# Patient Record
Sex: Male | Born: 1970 | Race: White | Hispanic: No | Smoking: Former smoker
Health system: Southern US, Community
[De-identification: ages and names within clinical notes are randomized; demographics above are authoritative.]

## PROBLEM LIST (undated history)

## (undated) DIAGNOSIS — R569 Unspecified convulsions: Secondary | ICD-10-CM

---

## 1998-05-03 ENCOUNTER — Emergency Department (HOSPITAL_COMMUNITY): Admission: EM | Admit: 1998-05-03 | Discharge: 1998-05-03 | Payer: Self-pay | Admitting: Emergency Medicine

## 2016-01-20 ENCOUNTER — Ambulatory Visit (INDEPENDENT_AMBULATORY_CARE_PROVIDER_SITE_OTHER): Payer: BLUE CROSS/BLUE SHIELD | Admitting: Family Medicine

## 2016-01-20 VITALS — BP 132/84 | HR 90 | Temp 97.9°F | Resp 16 | Ht 72.0 in | Wt 186.0 lb

## 2016-01-20 DIAGNOSIS — K648 Other hemorrhoids: Secondary | ICD-10-CM | POA: Diagnosis not present

## 2016-01-20 DIAGNOSIS — K602 Anal fissure, unspecified: Secondary | ICD-10-CM

## 2016-01-20 MED ORDER — DILTIAZEM GEL 2 %: 1.0000 "application " | Freq: Three times a day (TID) | CUTANEOUS | 1 refills | Status: DC

## 2016-01-20 NOTE — Progress Notes (Signed)
   Harold Ellis is a 45 y.o. male who presents to [facility] today for blood when wipting:  1.  Blood when wiping:  Patient is been having blood when he wipes from the past several weeks. He denies any constipation. States that he usually has a bowel movement once a day this sometimes it's once every other day. No abdominal pain. No diarrhea. He has been seeing light streaks of blood only when he wipes but not every time. No blood in the toilet bowl. He does have some pain when he wraps describes it as feeling "raw". He has not tried anything for relief other than wetting a toilet paper and wiping with this.  Never had something like this before.   No weight loss. No melena.  ROS as above.    PMH reviewed. Patient is a nonsmoker.   No past medical history on file. No past surgical history on file.  Medications reviewed. Current Outpatient Prescriptions  Medication Sig Dispense Refill  . Lacosamide (VIMPAT PO) Take by mouth.    Marland Kitchen. ZONISAMIDE PO Take by mouth.     No current facility-administered medications for this visit.      Physical Exam:  BP 132/84 (BP Location: Right Arm, Patient Position: Sitting, Cuff Size: Normal)   Pulse 90   Temp 97.9 F (36.6 C)   Resp 16   Ht 6' (1.829 m)   Wt 186 lb (84.4 kg)   SpO2 99%   BMI 25.23 kg/m  Gen:  Alert, cooperative patient who appears stated age in no acute distress.  Vital signs reviewed. Rectal:  Anal fissure noted around 1:00 lesion. Also has small internal hemorrhoid. Otherwise negative exam. Good rectal tone.  Assessment and Plan:  1.  Anal fissure:  - With hemorrhoid. -Treat with topical diltiazem. -Also can try Tucks pads or other over-the-counter creams/wipes. -For the next week or so he should use moist baby while present in the toilet paper to help it heal. -Evidently he was using hydrogen peroxide on the area. I did discuss he should stop using this or anything else potentially  abrasive. -Follow-up of this in 3-4 weeks if no improvement. Sooner if worsening.

## 2016-01-20 NOTE — Patient Instructions (Addendum)
Use the diltiazem cream on your buttocks 2-3 times a day for the next 8 weeks. This will help things clear up.  For the next week or so you should use moist baby wipes when wiping.  If this flares up in the future you can try over-the-counter Anusol cream or wipes to help.  It was good to meet you today  Anal Fissure, Adult Introduction An anal fissure is a small tear or crack in the skin around the opening of the butt (anus).Bleeding from the tear or crack usually stops on its own within a few minutes. The bleeding may happen every time you poop (have a bowel movement) until the tear or crack heals. Follow these instructions at home: Eating and drinking  Avoid bananas and dairy products. These foods can make it hard to poop.  Drink enough fluid to keep your pee (urine) clear or pale yellow.  Eat a lot of fruit, whole grains, and vegetables. General instructions  Keep the butt area as clean and dry as you can.  Take a warm water bath (sitz bath) as told by your doctor. Do not use soap.  Take over-the-counter and prescription medicines only as told by your doctor.  Use creams or ointments only as told by your doctor.  Keep all follow-up visits as told by your doctor. This is important. Contact a doctor if:  You have more bleeding.  You have a fever.  You have watery poop (diarrhea) that is mixed with blood.  You have pain.  You problem gets worse, not better. This information is not intended to replace advice given to you by your health care provider. Make sure you discuss any questions you have with your health care provider. Document Released: 09/19/2010 Document Revised: 06/29/2015 Document Reviewed: 04/18/2014  2017 Elsevier     IF you received an x-ray today, you will receive an invoice from [facility]. Please contact [facility] at [phone] with questions or concerns regarding your invoice.   IF you received labwork today, you will  receive an invoice from [facility]. Please contact [facility] at [phone] with questions or concerns regarding your invoice.   Our billing staff will not be able to assist you with questions regarding bills from these companies.  You will be contacted with the lab results as soon as they are available. The fastest way to get your results is to activate your My Chart account. Instructions are located on the last page of this paperwork. If you have not heard from us regarding the results in 2 weeks, please contact this office.

## 2016-01-22 ENCOUNTER — Telehealth: Payer: Self-pay

## 2016-01-22 NOTE — Telephone Encounter (Signed)
Pt states that he is having a hard time getting his meds filled that was given to him at last visit, he has checked with several pharmacies and they can not make the compound-would like to talk with someone about possibly changing the medication  Best number [phone]

## 2016-01-23 ENCOUNTER — Telehealth: Payer: Self-pay | Admitting: Emergency Medicine

## 2016-01-23 MED ORDER — NITROGLYCERIN 0.4 % RE OINT: TOPICAL_OINTMENT | RECTAL | 0 refills | Status: DC

## 2016-01-23 NOTE — Telephone Encounter (Signed)
Dr. Gwendolyn GrantWalden addressed and handled.

## 2016-01-23 NOTE — Telephone Encounter (Signed)
Dr. Gwendolyn GrantWalden, [facility] pharmacy called regarding prescription Nitroglycerin 0.4% oint. The cost is very expensive and the pharmacist does not know of any alternative med.  Please advise

## 2016-01-23 NOTE — Telephone Encounter (Signed)
Pt still has not heard anything about this medication.  Gwendolyn GrantWalden is out for a while.  Can someone please check into this for him?

## 2016-01-23 NOTE — Telephone Encounter (Signed)
Can we sub diltiazem cream? Not available

## 2016-01-23 NOTE — Telephone Encounter (Signed)
LMOM (OK per HIPPA) w/details of new Rx and message from Dr Gwendolyn GrantWalden. Asked for CBs with any ?s or concerns, or if SE of HAs continues or is intolerable.

## 2016-01-23 NOTE — Telephone Encounter (Signed)
Please contact the patient and inform him I have sent in topical nitroglycerin to use instead of the diltiazem.  This will likely cause a 30 minute headache, but it resolves after 30 minutes and only lasts the first few days of application.  (This is why we try diltiazem first, it doesn't cause headache.)  Let me know if questions.  JW

## 2019-06-01 ENCOUNTER — Emergency Department (HOSPITAL_COMMUNITY): Payer: Self-pay

## 2019-06-01 ENCOUNTER — Other Ambulatory Visit: Payer: Self-pay

## 2019-06-01 ENCOUNTER — Emergency Department (HOSPITAL_COMMUNITY)
Admission: EM | Admit: 2019-06-01 | Discharge: 2019-06-01 | Disposition: A | Discharge status: Home / Self Care | Payer: Self-pay | Attending: Emergency Medicine | Admitting: Emergency Medicine

## 2019-06-01 ENCOUNTER — Encounter (HOSPITAL_COMMUNITY): Payer: Self-pay | Admitting: Emergency Medicine

## 2019-06-01 DIAGNOSIS — Z79891 Long term (current) use of opiate analgesic: Secondary | ICD-10-CM | POA: Insufficient documentation

## 2019-06-01 DIAGNOSIS — Z79899 Other long term (current) drug therapy: Secondary | ICD-10-CM | POA: Insufficient documentation

## 2019-06-01 DIAGNOSIS — R404 Transient alteration of awareness: Secondary | ICD-10-CM

## 2019-06-01 DIAGNOSIS — R4182 Altered mental status, unspecified: Secondary | ICD-10-CM | POA: Insufficient documentation

## 2019-06-01 DIAGNOSIS — R4189 Other symptoms and signs involving cognitive functions and awareness: Secondary | ICD-10-CM

## 2019-06-01 DIAGNOSIS — T1490XA Injury, unspecified, initial encounter: Secondary | ICD-10-CM

## 2019-06-01 DIAGNOSIS — Y990 Civilian activity done for income or pay: Secondary | ICD-10-CM | POA: Insufficient documentation

## 2019-06-01 DIAGNOSIS — Z8669 Personal history of other diseases of the nervous system and sense organs: Secondary | ICD-10-CM | POA: Insufficient documentation

## 2019-06-01 LAB — COMPREHENSIVE METABOLIC PANEL
ALT: 16 U/L (ref 0–44)
AST: 19 U/L (ref 15–41)
Albumin: 4 g/dL (ref 3.5–5.0)
Alkaline Phosphatase: 112 U/L (ref 38–126)
Anion gap: 8 (ref 5–15)
BUN: 12 mg/dL (ref 6–20)
CO2: 21 mmol/L — ABNORMAL LOW (ref 22–32)
Calcium: 8.7 mg/dL — ABNORMAL LOW (ref 8.9–10.3)
Chloride: 111 mmol/L (ref 98–111)
Creatinine, Ser: 1.13 mg/dL (ref 0.61–1.24)
GFR calc Af Amer: >60 mL/min (ref >60)
GFR calc non Af Amer: >60 mL/min (ref >60)
Glucose, Bld: 113 mg/dL — ABNORMAL HIGH (ref 70–99)
Potassium: 4.1 mmol/L (ref 3.5–5.1)
Sodium: 140 mmol/L (ref 135–145)
Total Bilirubin: 0.5 mg/dL (ref 0.3–1.2)
Total Protein: 6.3 g/dL — ABNORMAL LOW (ref 6.5–8.1)

## 2019-06-01 LAB — I-STAT CHEM 8, ED
BUN: 11 mg/dL (ref 6–20)
Calcium, Ion: 1.16 mmol/L (ref 1.15–1.40)
Chloride: 111 mmol/L (ref 98–111)
Creatinine, Ser: 1 mg/dL (ref 0.61–1.24)
Glucose, Bld: 111 mg/dL — ABNORMAL HIGH (ref 70–99)
HCT: 43 % (ref 39.0–52.0)
Hemoglobin: 14.6 g/dL (ref 13.0–17.0)
Potassium: 4 mmol/L (ref 3.5–5.1)
Sodium: 140 mmol/L (ref 135–145)
TCO2: 19 mmol/L — ABNORMAL LOW (ref 22–32)

## 2019-06-01 LAB — CBC
HCT: 43.3 % (ref 39.0–52.0)
Hemoglobin: 14.9 g/dL (ref 13.0–17.0)
MCH: 32.9 pg (ref 26.0–34.0)
MCHC: 34.4 g/dL (ref 30.0–36.0)
MCV: 95.6 fL (ref 80.0–100.0)
Platelets: 248 10*3/uL (ref 150–400)
RBC: 4.53 MIL/uL (ref 4.22–5.81)
RDW: 12.3 % (ref 11.5–15.5)
WBC: 4.7 10*3/uL (ref 4.0–10.5)
nRBC: 0 % (ref 0.0–0.2)

## 2019-06-01 LAB — SALICYLATE LEVEL: Salicylate Lvl: <7 mg/dL — ABNORMAL LOW (ref 7.0–30.0)

## 2019-06-01 LAB — PROTIME-INR
INR: 1 (ref 0.8–1.2)
Prothrombin Time: 13.1 seconds (ref 11.4–15.2)

## 2019-06-01 LAB — ETHANOL: Alcohol, Ethyl (B): <10 mg/dL (ref <10)

## 2019-06-01 LAB — ACETAMINOPHEN LEVEL: Acetaminophen (Tylenol), Serum: <10 ug/mL — ABNORMAL LOW (ref 10–30)

## 2019-06-01 LAB — SAMPLE TO BLOOD BANK

## 2019-06-01 LAB — LACTIC ACID, PLASMA: Lactic Acid, Venous: 1.2 mmol/L (ref 0.5–1.9)
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
## 2019-06-01 NOTE — ED Notes (Signed)
Patient verbalizes understanding of discharge instructions. Opportunity for questioning and answers were provided. Armband removed by staff, pt discharged from ED.  
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
## 2022-05-03 ENCOUNTER — Emergency Department (HOSPITAL_COMMUNITY): Payer: Managed Care, Other (non HMO)

## 2022-05-03 ENCOUNTER — Encounter (HOSPITAL_COMMUNITY): Payer: Self-pay

## 2022-05-03 ENCOUNTER — Inpatient Hospital Stay (HOSPITAL_COMMUNITY)
Admission: EM | Admit: 2022-05-03 | Discharge: 2022-05-06 | DRG: 982 | Disposition: A | Discharge status: Home / Self Care | Payer: Managed Care, Other (non HMO) | Attending: General Surgery | Admitting: General Surgery

## 2022-05-03 ENCOUNTER — Other Ambulatory Visit: Payer: Self-pay

## 2022-05-03 DIAGNOSIS — Y92410 Unspecified street and highway as the place of occurrence of the external cause: Secondary | ICD-10-CM

## 2022-05-03 DIAGNOSIS — S2232XA Fracture of one rib, left side, initial encounter for closed fracture: Secondary | ICD-10-CM | POA: Diagnosis present

## 2022-05-03 DIAGNOSIS — S0240DA Maxillary fracture, left side, initial encounter for closed fracture: Secondary | ICD-10-CM | POA: Diagnosis present

## 2022-05-03 DIAGNOSIS — S32049A Unspecified fracture of fourth lumbar vertebra, initial encounter for closed fracture: Secondary | ICD-10-CM | POA: Diagnosis present

## 2022-05-03 DIAGNOSIS — S32009A Unspecified fracture of unspecified lumbar vertebra, initial encounter for closed fracture: Secondary | ICD-10-CM | POA: Diagnosis present

## 2022-05-03 DIAGNOSIS — S32040A Wedge compression fracture of fourth lumbar vertebra, initial encounter for closed fracture: Secondary | ICD-10-CM

## 2022-05-03 DIAGNOSIS — G40909 Epilepsy, unspecified, not intractable, without status epilepticus: Secondary | ICD-10-CM | POA: Diagnosis present

## 2022-05-03 DIAGNOSIS — S066XAA Traumatic subarachnoid hemorrhage with loss of consciousness status unknown, initial encounter: Secondary | ICD-10-CM | POA: Diagnosis not present

## 2022-05-03 DIAGNOSIS — F17290 Nicotine dependence, other tobacco product, uncomplicated: Secondary | ICD-10-CM | POA: Diagnosis present

## 2022-05-03 DIAGNOSIS — I609 Nontraumatic subarachnoid hemorrhage, unspecified: Secondary | ICD-10-CM

## 2022-05-03 DIAGNOSIS — S02602A Fracture of unspecified part of body of left mandible, initial encounter for closed fracture: Secondary | ICD-10-CM | POA: Diagnosis present

## 2022-05-03 DIAGNOSIS — H1132 Conjunctival hemorrhage, left eye: Secondary | ICD-10-CM | POA: Diagnosis present

## 2022-05-03 DIAGNOSIS — S0240FA Zygomatic fracture, left side, initial encounter for closed fracture: Secondary | ICD-10-CM | POA: Diagnosis present

## 2022-05-03 DIAGNOSIS — S51012A Laceration without foreign body of left elbow, initial encounter: Secondary | ICD-10-CM | POA: Diagnosis present

## 2022-05-03 DIAGNOSIS — R413 Other amnesia: Secondary | ICD-10-CM | POA: Diagnosis present

## 2022-05-03 DIAGNOSIS — S02411A LeFort I fracture, initial encounter for closed fracture: Secondary | ICD-10-CM | POA: Diagnosis present

## 2022-05-03 HISTORY — DX: Unspecified convulsions: R56.9

## 2022-05-03 LAB — COMPREHENSIVE METABOLIC PANEL
ALT: 32 U/L (ref 0–44)
AST: 42 U/L — ABNORMAL HIGH (ref 15–41)
Albumin: 4.1 g/dL (ref 3.5–5.0)
Alkaline Phosphatase: 85 U/L (ref 38–126)
Anion gap: 10 (ref 5–15)
BUN: 16 mg/dL (ref 6–20)
CO2: 20 mmol/L — ABNORMAL LOW (ref 22–32)
Calcium: 8.2 mg/dL — ABNORMAL LOW (ref 8.9–10.3)
Chloride: 102 mmol/L (ref 98–111)
Creatinine, Ser: 1.15 mg/dL (ref 0.61–1.24)
GFR, Estimated: >60 mL/min (ref >60)
Glucose, Bld: 126 mg/dL — ABNORMAL HIGH (ref 70–99)
Potassium: 4.5 mmol/L (ref 3.5–5.1)
Sodium: 132 mmol/L — ABNORMAL LOW (ref 135–145)
Total Bilirubin: 0.7 mg/dL (ref 0.3–1.2)
Total Protein: 6.7 g/dL (ref 6.5–8.1)

## 2022-05-03 LAB — CBC WITH DIFFERENTIAL/PLATELET
Abs Immature Granulocytes: 0.11 10*3/uL — ABNORMAL HIGH (ref 0.00–0.07)
Basophils Absolute: 0.1 10*3/uL (ref 0.0–0.1)
Basophils Relative: 1 %
Eosinophils Absolute: 0 10*3/uL (ref 0.0–0.5)
Eosinophils Relative: 0 %
HCT: 43 % (ref 39.0–52.0)
Hemoglobin: 14.4 g/dL (ref 13.0–17.0)
Immature Granulocytes: 1 %
Lymphocytes Relative: 9 %
Lymphs Abs: 1 10*3/uL (ref 0.7–4.0)
MCH: 33 pg (ref 26.0–34.0)
MCHC: 33.5 g/dL (ref 30.0–36.0)
MCV: 98.6 fL (ref 80.0–100.0)
Monocytes Absolute: 0.5 10*3/uL (ref 0.1–1.0)
Monocytes Relative: 4 %
Neutro Abs: 10.3 10*3/uL — ABNORMAL HIGH (ref 1.7–7.7)
Neutrophils Relative %: 85 %
Platelets: 187 10*3/uL (ref 150–400)
RBC: 4.36 MIL/uL (ref 4.22–5.81)
RDW: 12.3 % (ref 11.5–15.5)
WBC: 12 10*3/uL — ABNORMAL HIGH (ref 4.0–10.5)
nRBC: 0 % (ref 0.0–0.2)

## 2022-05-03 LAB — RAPID URINE DRUG SCREEN, HOSP PERFORMED
Amphetamines: NOT DETECTED
Barbiturates: NOT DETECTED
Benzodiazepines: POSITIVE — AB
Cocaine: NOT DETECTED
Opiates: NOT DETECTED
Tetrahydrocannabinol: NOT DETECTED

## 2022-05-03 LAB — TYPE AND SCREEN
ABO/RH(D): O NEG
Antibody Screen: NEGATIVE

## 2022-05-03 LAB — CBG MONITORING, ED: Glucose-Capillary: 130 mg/dL — ABNORMAL HIGH (ref 70–99)

## 2022-05-03 LAB — ETHANOL: Alcohol, Ethyl (B): <10 mg/dL (ref <10)

## 2022-05-03 MED ORDER — LEVETIRACETAM IN NACL 1500 MG/100ML IV SOLN
1500.0000 mg | Freq: Once | INTRAVENOUS | Status: AC
  Administered 2022-05-04: 1500 mg via INTRAVENOUS
  Filled 2022-05-03 (×2): qty 100

## 2022-05-03 MED ORDER — LACTATED RINGERS IV SOLN: INTRAVENOUS | Status: DC

## 2022-05-03 MED ORDER — IOHEXOL 350 MG/ML SOLN
75.0000 mL | Freq: Once | INTRAVENOUS | Status: AC | PRN
  Administered 2022-05-03: 75 mL via INTRAVENOUS

## 2022-05-03 MED ORDER — MIDAZOLAM HCL 2 MG/2ML IJ SOLN
INTRAMUSCULAR | Status: AC
  Filled 2022-05-03: qty 2

## 2022-05-03 MED ORDER — MIDAZOLAM HCL 2 MG/2ML IJ SOLN
2.0000 mg | Freq: Once | INTRAMUSCULAR | Status: AC
  Administered 2022-05-03: 2 mg via INTRAVENOUS

## 2022-05-04 ENCOUNTER — Inpatient Hospital Stay (HOSPITAL_COMMUNITY): Payer: Managed Care, Other (non HMO) | Admitting: Anesthesiology

## 2022-05-04 ENCOUNTER — Other Ambulatory Visit: Payer: Self-pay

## 2022-05-04 ENCOUNTER — Encounter (HOSPITAL_COMMUNITY): Admission: EM | Disposition: A | Discharge status: Home / Self Care | Payer: Self-pay | Source: Home / Self Care

## 2022-05-04 ENCOUNTER — Encounter (HOSPITAL_COMMUNITY): Payer: Self-pay

## 2022-05-04 DIAGNOSIS — S51012A Laceration without foreign body of left elbow, initial encounter: Secondary | ICD-10-CM | POA: Diagnosis present

## 2022-05-04 DIAGNOSIS — S32049A Unspecified fracture of fourth lumbar vertebra, initial encounter for closed fracture: Secondary | ICD-10-CM | POA: Diagnosis present

## 2022-05-04 DIAGNOSIS — S0240DA Maxillary fracture, left side, initial encounter for closed fracture: Secondary | ICD-10-CM | POA: Diagnosis present

## 2022-05-04 DIAGNOSIS — S02411A LeFort I fracture, initial encounter for closed fracture: Secondary | ICD-10-CM | POA: Diagnosis present

## 2022-05-04 DIAGNOSIS — S02602A Fracture of unspecified part of body of left mandible, initial encounter for closed fracture: Secondary | ICD-10-CM | POA: Diagnosis not present

## 2022-05-04 DIAGNOSIS — S0240FA Zygomatic fracture, left side, initial encounter for closed fracture: Secondary | ICD-10-CM

## 2022-05-04 DIAGNOSIS — G40909 Epilepsy, unspecified, not intractable, without status epilepticus: Secondary | ICD-10-CM | POA: Diagnosis present

## 2022-05-04 DIAGNOSIS — H1132 Conjunctival hemorrhage, left eye: Secondary | ICD-10-CM | POA: Diagnosis present

## 2022-05-04 DIAGNOSIS — R413 Other amnesia: Secondary | ICD-10-CM | POA: Diagnosis present

## 2022-05-04 DIAGNOSIS — S066XAA Traumatic subarachnoid hemorrhage with loss of consciousness status unknown, initial encounter: Secondary | ICD-10-CM | POA: Diagnosis present

## 2022-05-04 DIAGNOSIS — I609 Nontraumatic subarachnoid hemorrhage, unspecified: Secondary | ICD-10-CM

## 2022-05-04 DIAGNOSIS — Y92410 Unspecified street and highway as the place of occurrence of the external cause: Secondary | ICD-10-CM | POA: Diagnosis not present

## 2022-05-04 DIAGNOSIS — F17290 Nicotine dependence, other tobacco product, uncomplicated: Secondary | ICD-10-CM | POA: Diagnosis present

## 2022-05-04 DIAGNOSIS — S2232XA Fracture of one rib, left side, initial encounter for closed fracture: Secondary | ICD-10-CM | POA: Diagnosis present

## 2022-05-04 DIAGNOSIS — S32009A Unspecified fracture of unspecified lumbar vertebra, initial encounter for closed fracture: Secondary | ICD-10-CM | POA: Diagnosis present

## 2022-05-04 HISTORY — PX: ORIF MANDIBULAR FRACTURE: SHX2127

## 2022-05-04 LAB — HIV ANTIBODY (ROUTINE TESTING W REFLEX): HIV Screen 4th Generation wRfx: NONREACTIVE

## 2022-05-04 LAB — ABO/RH: ABO/RH(D): O NEG

## 2022-05-04 SURGERY — OPEN REDUCTION INTERNAL FIXATION (ORIF) MANDIBULAR FRACTURE
Anesthesia: General | Site: Mouth

## 2022-05-04 MED ORDER — CEFAZOLIN SODIUM-DEXTROSE 1-4 GM/50ML-% IV SOLN
1.0000 g | Freq: Three times a day (TID) | INTRAVENOUS | Status: DC
  Administered 2022-05-04 – 2022-05-06 (×6): 1 g via INTRAVENOUS
  Filled 2022-05-04 (×7): qty 50

## 2022-05-04 MED ORDER — LIDOCAINE-EPINEPHRINE 1 %-1:100000 IJ SOLN
INTRAMUSCULAR | Status: AC
  Filled 2022-05-04: qty 1

## 2022-05-04 MED ORDER — PHENYLEPHRINE 80 MCG/ML (10ML) SYRINGE FOR IV PUSH (FOR BLOOD PRESSURE SUPPORT)
PREFILLED_SYRINGE | INTRAVENOUS | Status: AC
  Filled 2022-05-04: qty 20

## 2022-05-04 MED ORDER — ONDANSETRON HCL 4 MG/2ML IJ SOLN
INTRAMUSCULAR | Status: AC
  Filled 2022-05-04: qty 2

## 2022-05-04 MED ORDER — ROCURONIUM BROMIDE 10 MG/ML (PF) SYRINGE
PREFILLED_SYRINGE | INTRAVENOUS | Status: DC | PRN
  Administered 2022-05-04: 10 mg via INTRAVENOUS
  Administered 2022-05-04: 60 mg via INTRAVENOUS
  Administered 2022-05-04: 10 mg via INTRAVENOUS

## 2022-05-04 MED ORDER — ONDANSETRON HCL 4 MG/2ML IJ SOLN
INTRAMUSCULAR | Status: DC | PRN
  Administered 2022-05-04: 4 mg via INTRAVENOUS

## 2022-05-04 MED ORDER — DEXAMETHASONE SODIUM PHOSPHATE 10 MG/ML IJ SOLN
INTRAMUSCULAR | Status: AC
  Filled 2022-05-04: qty 1

## 2022-05-04 MED ORDER — HYDROMORPHONE HCL 1 MG/ML IJ SOLN
INTRAMUSCULAR | Status: AC
  Filled 2022-05-04: qty 0.5

## 2022-05-04 MED ORDER — TOBRAMYCIN-DEXAMETHASONE 0.3-0.1 % OP OINT
TOPICAL_OINTMENT | Freq: Two times a day (BID) | OPHTHALMIC | Status: DC
  Administered 2022-05-04 – 2022-05-05 (×2): 1 via OPHTHALMIC
  Filled 2022-05-04: qty 3.5

## 2022-05-04 MED ORDER — ORAL CARE MOUTH RINSE: 15.0000 mL | Freq: Once | OROMUCOSAL | Status: AC

## 2022-05-04 MED ORDER — OXYMETAZOLINE HCL 0.05 % NA SOLN
NASAL | Status: DC | PRN
  Administered 2022-05-04: 1 via NASAL

## 2022-05-04 MED ORDER — LEVETIRACETAM IN NACL 500 MG/100ML IV SOLN
500.0000 mg | Freq: Two times a day (BID) | INTRAVENOUS | Status: DC
  Administered 2022-05-04 – 2022-05-06 (×5): 500 mg via INTRAVENOUS
  Filled 2022-05-04 (×5): qty 100

## 2022-05-04 MED ORDER — LIDOCAINE 2% (20 MG/ML) 5 ML SYRINGE
INTRAMUSCULAR | Status: AC
  Filled 2022-05-04: qty 5

## 2022-05-04 MED ORDER — CEFAZOLIN SODIUM 1 G IJ SOLR
INTRAMUSCULAR | Status: AC
  Filled 2022-05-04: qty 20

## 2022-05-04 MED ORDER — ONDANSETRON 4 MG PO TBDP: 4.0000 mg | ORAL_TABLET | Freq: Four times a day (QID) | ORAL | Status: DC | PRN

## 2022-05-04 MED ORDER — OXYMETAZOLINE HCL 0.05 % NA SOLN
NASAL | Status: DC | PRN
  Administered 2022-05-04: 1 via TOPICAL

## 2022-05-04 MED ORDER — OXYCODONE HCL 5 MG PO TABS: 5.0000 mg | ORAL_TABLET | ORAL | Status: DC | PRN

## 2022-05-04 MED ORDER — CHLORHEXIDINE GLUCONATE 0.12 % MT SOLN
OROMUCOSAL | Status: AC
  Administered 2022-05-04: 15 mL via OROMUCOSAL
  Filled 2022-05-04: qty 15

## 2022-05-04 MED ORDER — CEFAZOLIN SODIUM-DEXTROSE 2-3 GM-%(50ML) IV SOLR
INTRAVENOUS | Status: DC | PRN
  Administered 2022-05-04: 2 g via INTRAVENOUS

## 2022-05-04 MED ORDER — OXYMETAZOLINE HCL 0.05 % NA SOLN
NASAL | Status: AC
  Filled 2022-05-04: qty 30

## 2022-05-04 MED ORDER — LACTATED RINGERS IV SOLN: INTRAVENOUS | Status: DC

## 2022-05-04 MED ORDER — CHLORHEXIDINE GLUCONATE 0.12 % MT SOLN
15.0000 mL | Freq: Four times a day (QID) | OROMUCOSAL | Status: DC
  Administered 2022-05-04 – 2022-05-06 (×9): 15 mL via OROMUCOSAL
  Filled 2022-05-04 (×9): qty 15

## 2022-05-04 MED ORDER — PROPOFOL 10 MG/ML IV BOLUS
INTRAVENOUS | Status: AC
  Filled 2022-05-04: qty 20

## 2022-05-04 MED ORDER — LIDOCAINE-EPINEPHRINE 1 %-1:100000 IJ SOLN
INTRAMUSCULAR | Status: DC | PRN
  Administered 2022-05-04: 8 mL

## 2022-05-04 MED ORDER — PROMETHAZINE HCL 25 MG/ML IJ SOLN: 6.2500 mg | INTRAMUSCULAR | Status: DC | PRN

## 2022-05-04 MED ORDER — HYDROMORPHONE HCL 1 MG/ML IJ SOLN
INTRAMUSCULAR | Status: DC | PRN
  Administered 2022-05-04: .5 mg via INTRAVENOUS

## 2022-05-04 MED ORDER — OXYCODONE HCL 5 MG PO TABS: 5.0000 mg | ORAL_TABLET | Freq: Once | ORAL | Status: DC | PRN

## 2022-05-04 MED ORDER — FENTANYL CITRATE (PF) 250 MCG/5ML IJ SOLN
INTRAMUSCULAR | Status: AC
  Filled 2022-05-04: qty 5

## 2022-05-04 MED ORDER — HYDROMORPHONE HCL 1 MG/ML IJ SOLN: 0.2500 mg | INTRAMUSCULAR | Status: DC | PRN

## 2022-05-04 MED ORDER — ACETAMINOPHEN 500 MG PO TABS: 1000.0000 mg | ORAL_TABLET | Freq: Four times a day (QID) | ORAL | Status: DC

## 2022-05-04 MED ORDER — TOBRAMYCIN-DEXAMETHASONE 0.3-0.1 % OP OINT
TOPICAL_OINTMENT | OPHTHALMIC | Status: AC
  Filled 2022-05-04: qty 3.5

## 2022-05-04 MED ORDER — DEXAMETHASONE SODIUM PHOSPHATE 10 MG/ML IJ SOLN
INTRAMUSCULAR | Status: DC | PRN
  Administered 2022-05-04: 10 mg via INTRAVENOUS

## 2022-05-04 MED ORDER — METHOCARBAMOL 500 MG PO TABS
1000.0000 mg | ORAL_TABLET | Freq: Three times a day (TID) | ORAL | Status: DC
  Filled 2022-05-04 (×3): qty 2

## 2022-05-04 MED ORDER — BSS IO SOLN
INTRAOCULAR | Status: AC
  Filled 2022-05-04: qty 15

## 2022-05-04 MED ORDER — SODIUM CHLORIDE 0.9 % IV SOLN: INTRAVENOUS | Status: DC

## 2022-05-04 MED ORDER — FENTANYL CITRATE (PF) 250 MCG/5ML IJ SOLN
INTRAMUSCULAR | Status: DC | PRN
  Administered 2022-05-04: 50 ug via INTRAVENOUS
  Administered 2022-05-04: 100 ug via INTRAVENOUS
  Administered 2022-05-04 (×2): 50 ug via INTRAVENOUS

## 2022-05-04 MED ORDER — LIDOCAINE 2% (20 MG/ML) 5 ML SYRINGE
INTRAMUSCULAR | Status: DC | PRN
  Administered 2022-05-04: 40 mg via INTRAVENOUS

## 2022-05-04 MED ORDER — SODIUM CHLORIDE (PF) 0.9 % IJ SOLN
INTRAMUSCULAR | Status: AC
  Filled 2022-05-04: qty 10

## 2022-05-04 MED ORDER — MORPHINE SULFATE (PF) 4 MG/ML IV SOLN: 4.0000 mg | INTRAVENOUS | Status: DC | PRN

## 2022-05-04 MED ORDER — MIDAZOLAM HCL 2 MG/2ML IJ SOLN: 0.5000 mg | Freq: Once | INTRAMUSCULAR | Status: DC | PRN

## 2022-05-04 MED ORDER — CHLORHEXIDINE GLUCONATE 0.12 % MT SOLN: 15.0000 mL | Freq: Once | OROMUCOSAL | Status: AC

## 2022-05-04 MED ORDER — ARTIFICIAL TEARS OPHTHALMIC OINT
TOPICAL_OINTMENT | OPHTHALMIC | Status: DC | PRN
  Administered 2022-05-04: 1 via OPHTHALMIC

## 2022-05-04 MED ORDER — ENOXAPARIN SODIUM 30 MG/0.3ML IJ SOSY
30.0000 mg | PREFILLED_SYRINGE | Freq: Two times a day (BID) | INTRAMUSCULAR | Status: DC
  Filled 2022-05-04: qty 0.3

## 2022-05-04 MED ORDER — ROCURONIUM BROMIDE 10 MG/ML (PF) SYRINGE
PREFILLED_SYRINGE | INTRAVENOUS | Status: AC
  Filled 2022-05-04: qty 10

## 2022-05-04 MED ORDER — OXYCODONE HCL 5 MG/5ML PO SOLN: 5.0000 mg | Freq: Once | ORAL | Status: DC | PRN

## 2022-05-04 MED ORDER — LEVETIRACETAM IN NACL 500 MG/100ML IV SOLN: 500.0000 mg | Freq: Two times a day (BID) | INTRAVENOUS | Status: DC

## 2022-05-04 MED ORDER — SUGAMMADEX SODIUM 200 MG/2ML IV SOLN
INTRAVENOUS | Status: DC | PRN
  Administered 2022-05-04: 200 mg via INTRAVENOUS

## 2022-05-04 MED ORDER — BSS IO SOLN
INTRAOCULAR | Status: DC | PRN
  Administered 2022-05-04: 15 mL

## 2022-05-04 MED ORDER — ACETAMINOPHEN 160 MG/5ML PO SOLN
1000.0000 mg | Freq: Four times a day (QID) | ORAL | Status: DC
  Administered 2022-05-04 – 2022-05-06 (×7): 1000 mg via ORAL
  Filled 2022-05-04 (×9): qty 40.6

## 2022-05-04 MED ORDER — MEPERIDINE HCL 25 MG/ML IJ SOLN: 6.2500 mg | INTRAMUSCULAR | Status: DC | PRN

## 2022-05-04 MED ORDER — PROPOFOL 10 MG/ML IV BOLUS
INTRAVENOUS | Status: DC | PRN
  Administered 2022-05-04: 120 mg via INTRAVENOUS

## 2022-05-04 MED ORDER — ONDANSETRON HCL 4 MG/2ML IJ SOLN: 4.0000 mg | Freq: Four times a day (QID) | INTRAMUSCULAR | Status: DC | PRN

## 2022-05-04 MED ORDER — DOCUSATE SODIUM 100 MG PO CAPS
100.0000 mg | ORAL_CAPSULE | Freq: Two times a day (BID) | ORAL | Status: DC
  Filled 2022-05-04 (×3): qty 1

## 2022-05-04 MED ORDER — TOBRAMYCIN 0.3 % OP OINT
TOPICAL_OINTMENT | OPHTHALMIC | Status: DC | PRN
  Administered 2022-05-04: 1 via OPHTHALMIC

## 2022-05-04 SURGICAL SUPPLY — 48 items
BAG COUNTER SPONGE SURGICOUNT (BAG) ×1 IMPLANT
BAG SPNG CNTER NS LX DISP (BAG) ×1
BLADE CLIPPER SURG (BLADE) IMPLANT
BLADE SURG 15 STRL LF DISP TIS (BLADE) ×1 IMPLANT
BLADE SURG 15 STRL SS (BLADE) ×1
BLADE TRAUMAONE IMF (BLADE) IMPLANT
CANISTER SUCT 3000ML PPV (MISCELLANEOUS) ×1 IMPLANT
COVER SURGICAL LIGHT HANDLE (MISCELLANEOUS) ×1 IMPLANT
DRILL TW 1.1X50 5MM STOP W/NT (MISCELLANEOUS) IMPLANT
DRILL TW 1.1X50 7MM STOP W/NT (MISCELLANEOUS) IMPLANT
ELECT REM PT RETURN 9FT ADLT (ELECTROSURGICAL) ×1
ELECTRODE REM PT RTRN 9FT ADLT (ELECTROSURGICAL) ×1 IMPLANT
GLOVE BIO SURGEON STRL SZ 6 (GLOVE) ×1 IMPLANT
GOWN STRL REUS W/ TWL LRG LVL3 (GOWN DISPOSABLE) ×2 IMPLANT
GOWN STRL REUS W/TWL LRG LVL3 (GOWN DISPOSABLE) ×2
KIT BASIN OR (CUSTOM PROCEDURE TRAY) ×1 IMPLANT
KIT TURNOVER KIT B (KITS) ×1 IMPLANT
NDL PRECISIONGLIDE 27X1.5 (NEEDLE) ×1 IMPLANT
NEEDLE PRECISIONGLIDE 27X1.5 (NEEDLE) ×1 IMPLANT
NS IRRIG 1000ML POUR BTL (IV SOLUTION) ×1 IMPLANT
PAD ARMBOARD 7.5X6 YLW CONV (MISCELLANEOUS) ×2 IMPLANT
PENCIL BUTTON HOLSTER BLD 10FT (ELECTRODE) ×1 IMPLANT
PLATE 4H REG STRAIGHT 1.5 (Plate) IMPLANT
PLATE RGD 1.5/0.6 14-HOLE CRVD (Plate) IMPLANT
PLATE RGD 1.5/0.6 4X6 LT LNG (Plate) IMPLANT
SCISSORS WIRE ANG 4 3/4 DISP (INSTRUMENTS) ×1 IMPLANT
SCREW HT SD X-DR 1.5X5 (Screw) IMPLANT
SCREW HT X DR EMRG 1.8X5MM (Screw) IMPLANT
SCREW NON LOCK HT X-DR 1.5X4 (Screw) IMPLANT
SCREW NON LOCK HT X-DR 1.5X6 (Screw) IMPLANT
SCREW SD IMF HEX 2.0X7 (Screw) IMPLANT
SCREW SELF DRILL HT 1.5/4MM (Screw) IMPLANT
SPIKE FLUID TRANSFER (MISCELLANEOUS) ×1 IMPLANT
STAPLER VISISTAT 35W (STAPLE) IMPLANT
STRIP CLOSURE SKIN 1/2X4 (GAUZE/BANDAGES/DRESSINGS) IMPLANT
SUT CHROMIC 4 0 SH 27 (SUTURE) IMPLANT
SUT PLAIN 5 0 P 3 18 (SUTURE) IMPLANT
SUT SILK 4 0 P 3 (SUTURE) IMPLANT
SUT VIC AB 4-0 P-3 18XBRD (SUTURE) IMPLANT
SUT VIC AB 4-0 P3 18 (SUTURE) ×1
SUT VIC AB 4-0 SH 27 (SUTURE) ×1
SUT VIC AB 4-0 SH 27XANBCTRL (SUTURE) IMPLANT
TOWEL GREEN STERILE FF (TOWEL DISPOSABLE) ×1 IMPLANT
TRAY ENT MC OR (CUSTOM PROCEDURE TRAY) ×1 IMPLANT
TW DRILL1.1X50 5MM STOP W/NT (MISCELLANEOUS) ×1
TW DRILL1.1X50 7MM STOP W/NT (MISCELLANEOUS) ×1
WATER STERILE IRR 1000ML POUR (IV SOLUTION) ×1 IMPLANT
WIRE 24 GAUGE OMINIMAX MMF (WIRE) IMPLANT

## 2022-05-05 MED ORDER — OXYCODONE HCL 5 MG/5ML PO SOLN
5.0000 mg | ORAL | Status: DC | PRN
  Administered 2022-05-06: 10 mg via ORAL
  Filled 2022-05-05: qty 10

## 2022-05-05 MED ORDER — CENOBAMATE 150 MG PO TABS
150.0000 mg | ORAL_TABLET | Freq: Every day | ORAL | Status: DC
  Filled 2022-05-05 (×2): qty 1

## 2022-05-05 MED ORDER — CENOBAMATE 100 MG PO TABS
100.0000 mg | ORAL_TABLET | Freq: Every day | ORAL | Status: DC
  Administered 2022-05-06: 100 mg via ORAL
  Filled 2022-05-05: qty 1

## 2022-05-06 ENCOUNTER — Other Ambulatory Visit (HOSPITAL_COMMUNITY): Payer: Self-pay

## 2022-05-06 ENCOUNTER — Encounter (HOSPITAL_COMMUNITY): Payer: Self-pay | Admitting: Plastic Surgery

## 2022-05-06 MED ORDER — CEPHALEXIN 250 MG/5ML PO SUSR
500.0000 mg | Freq: Four times a day (QID) | ORAL | Status: DC
  Administered 2022-05-06: 500 mg via ORAL
  Filled 2022-05-06 (×3): qty 10

## 2022-05-06 MED ORDER — METHOCARBAMOL 500 MG PO TABS: 1000.0000 mg | ORAL_TABLET | Freq: Three times a day (TID) | ORAL | Status: DC

## 2022-05-06 MED ORDER — OXYCODONE HCL 5 MG/5ML PO SOLN
5.0000 mg | Freq: Four times a day (QID) | ORAL | 0 refills | Status: DC | PRN
  Filled 2022-05-06: qty 50, 3d supply, fill #0

## 2022-05-06 MED ORDER — TOBRAMYCIN-DEXAMETHASONE 0.3-0.1 % OP OINT
TOPICAL_OINTMENT | Freq: Every day | OPHTHALMIC | 0 refills | Status: DC
  Filled 2022-05-06: qty 3.5, fill #0

## 2022-05-06 MED ORDER — METHOCARBAMOL 1000 MG/10ML IJ SOLN
1000.0000 mg | Freq: Three times a day (TID) | INTRAVENOUS | Status: DC
  Filled 2022-05-06: qty 10

## 2022-05-06 MED ORDER — ACETAMINOPHEN 160 MG/5ML PO SUSP
1000.0000 mg | Freq: Four times a day (QID) | ORAL | 0 refills | Status: DC | PRN
  Filled 2022-05-06: qty 118, 1d supply, fill #0

## 2022-05-06 MED ORDER — CEPHALEXIN 250 MG/5ML PO SUSR
500.0000 mg | Freq: Four times a day (QID) | ORAL | 0 refills | Status: AC
  Filled 2022-05-06: qty 100, 1d supply, fill #0

## 2022-05-06 MED ORDER — CHLORHEXIDINE GLUCONATE 0.12 % MT SOLN
15.0000 mL | Freq: Four times a day (QID) | OROMUCOSAL | 0 refills | Status: DC
  Filled 2022-05-06: qty 473, 8d supply, fill #0

## 2022-05-06 MED ORDER — METHOCARBAMOL 1000 MG/10ML IJ SOLN
1000.0000 mg | Freq: Three times a day (TID) | INTRAVENOUS | Status: DC
  Administered 2022-05-06: 1000 mg via INTRAVENOUS
  Filled 2022-05-06 (×3): qty 10
  Filled 2022-05-06: qty 1000

## 2022-05-06 MED ORDER — TOBRAMYCIN-DEXAMETHASONE 0.3-0.1 % OP OINT: TOPICAL_OINTMENT | Freq: Every day | OPHTHALMIC | Status: DC

## 2022-05-06 MED ORDER — METHOCARBAMOL 500 MG PO TABS
1000.0000 mg | ORAL_TABLET | Freq: Three times a day (TID) | ORAL | 0 refills | Status: DC | PRN
  Filled 2022-05-06: qty 60, 10d supply, fill #0
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
## 2022-05-22 ENCOUNTER — Other Ambulatory Visit: Payer: Self-pay

## 2022-05-22 ENCOUNTER — Other Ambulatory Visit: Payer: Self-pay | Admitting: Family Medicine

## 2022-05-22 ENCOUNTER — Ambulatory Visit: Payer: Managed Care, Other (non HMO)

## 2022-05-22 ENCOUNTER — Ambulatory Visit
Payer: Managed Care, Other (non HMO) | Attending: General Surgery | Admitting: Rehabilitative and Restorative Service Providers"

## 2022-05-22 ENCOUNTER — Encounter: Payer: Self-pay | Admitting: Rehabilitative and Restorative Service Providers"

## 2022-05-22 ENCOUNTER — Ambulatory Visit: Payer: Managed Care, Other (non HMO) | Admitting: Physical Therapy

## 2022-05-22 DIAGNOSIS — M25512 Pain in left shoulder: Secondary | ICD-10-CM | POA: Diagnosis not present

## 2022-05-22 DIAGNOSIS — R229 Localized swelling, mass and lump, unspecified: Secondary | ICD-10-CM

## 2022-05-22 DIAGNOSIS — R2681 Unsteadiness on feet: Secondary | ICD-10-CM

## 2022-05-22 DIAGNOSIS — R41841 Cognitive communication deficit: Secondary | ICD-10-CM | POA: Diagnosis present
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
## 2022-05-30 ENCOUNTER — Ambulatory Visit
Admission: RE | Admit: 2022-05-30 | Discharge: 2022-05-30 | Disposition: A | Discharge status: Home / Self Care | Payer: Managed Care, Other (non HMO) | Source: Ambulatory Visit | Attending: Family Medicine | Admitting: Family Medicine

## 2022-05-30 DIAGNOSIS — R229 Localized swelling, mass and lump, unspecified: Secondary | ICD-10-CM

## 2022-07-08 ENCOUNTER — Other Ambulatory Visit (HOSPITAL_COMMUNITY): Payer: Self-pay | Admitting: Specialist

## 2022-07-08 DIAGNOSIS — G40209 Localization-related (focal) (partial) symptomatic epilepsy and epileptic syndromes with complex partial seizures, not intractable, without status epilepticus: Secondary | ICD-10-CM

## 2022-07-12 ENCOUNTER — Ambulatory Visit (HOSPITAL_COMMUNITY)
Admission: RE | Admit: 2022-07-12 | Discharge: 2022-07-12 | Disposition: A | Discharge status: Home / Self Care | Payer: Managed Care, Other (non HMO) | Source: Ambulatory Visit | Attending: Specialist | Admitting: Specialist

## 2022-07-12 ENCOUNTER — Ambulatory Visit (HOSPITAL_COMMUNITY): Payer: Managed Care, Other (non HMO)

## 2022-07-12 ENCOUNTER — Encounter (HOSPITAL_COMMUNITY): Payer: Self-pay

## 2022-07-12 DIAGNOSIS — G40209 Localization-related (focal) (partial) symptomatic epilepsy and epileptic syndromes with complex partial seizures, not intractable, without status epilepticus: Secondary | ICD-10-CM | POA: Insufficient documentation

## 2022-07-20 ENCOUNTER — Encounter (HOSPITAL_COMMUNITY): Payer: Self-pay

## 2022-07-20 ENCOUNTER — Ambulatory Visit (HOSPITAL_COMMUNITY): Payer: Managed Care, Other (non HMO)

## 2022-07-31 ENCOUNTER — Other Ambulatory Visit: Payer: Self-pay | Admitting: Orthopedic Surgery

## 2022-08-01 NOTE — Progress Notes (Signed)
Pt. Needs orders for surgery. 

## 2022-08-01 NOTE — Patient Instructions (Addendum)
DUE TO COVID-19 ONLY TWO VISITORS  (aged 52 and older)  ARE ALLOWED TO COME WITH YOU AND STAY IN THE WAITING ROOM ONLY DURING PRE OP AND PROCEDURE.   **NO VISITORS ARE ALLOWED IN THE SHORT STAY AREA OR RECOVERY ROOM!!**  IF YOU WILL BE ADMITTED INTO THE HOSPITAL YOU ARE ALLOWED ONLY FOUR SUPPORT PEOPLE DURING VISITATION HOURS ONLY (7 AM -8PM)   The support person(s) must pass our screening, gel in and out, and wear a mask at all times, including in the patient's room. Patients must also wear a mask when staff or their support person are in the room. Visitors GUEST BADGE MUST BE WORN VISIBLY  One adult visitor may remain with you overnight and MUST be in the room by 8 P.M.     Your procedure is scheduled on: 08/06/22   Report to [facility] Main Entrance    Report to admitting at : 11:45 AM   Call this number if you have problems the morning of surgery [phone]   Do not eat food :After Midnight.   After Midnight you may have the following liquids until : 11:00 AM DAY OF SURGERY  Water Black Coffee (sugar ok, NO MILK/CREAM OR CREAMERS)  Tea (sugar ok, NO MILK/CREAM OR CREAMERS) regular and decaf                             Plain Jell-O (NO RED)                                           Fruit ices (not with fruit pulp, NO RED)                                     Popsicles (NO RED)                                                                  Juice: apple, WHITE grape, WHITE cranberry Sports drinks like Gatorade (NO RED)   Oral Hygiene is also important to reduce your risk of infection.                                    Remember - BRUSH YOUR TEETH THE MORNING OF SURGERY WITH YOUR REGULAR TOOTHPASTE  DENTURES WILL BE REMOVED PRIOR TO SURGERY PLEASE DO NOT APPLY "Poly grip" OR ADHESIVES!!!   Do NOT smoke after Midnight   Take these medicines the morning of surgery with A SIP OF WATER: cenobamate,lacosamide,zonisamide,mirtazapine.Tylenol as needed.                               You may not have any metal on your body including hair pins, jewelry, and body piercing             Do not wear lotions, powders, perfumes/cologne, or deodorant  Men may shave face and neck.   Do not bring valuables to the hospital. [place].   Contacts, glasses, or bridgework may not be worn into surgery.   Bring small overnight bag day of surgery.   DO NOT [place]. PHARMACY WILL DISPENSE MEDICATIONS LISTED ON YOUR MEDICATION LIST TO YOU DURING YOUR ADMISSION [place]!    Patients discharged on the day of surgery will not be allowed to drive home.  Someone NEEDS to stay with you for the first 24 hours after anesthesia.   Special Instructions: Bring a copy of your healthcare power of attorney and living will documents         the day of surgery if you haven't scanned them before.              Please read over the following fact sheets you were given: IF YOU HAVE QUESTIONS ABOUT YOUR PRE-OP INSTRUCTIONS PLEASE CALL [phone]    [facility] - Preparing for Surgery Before surgery, you can play an important role.  Because skin is not sterile, your skin needs to be as free of germs as possible.  You can reduce the number of germs on your skin by washing with CHG (chlorahexidine gluconate) soap before surgery.  CHG is an antiseptic cleaner which kills germs and bonds with the skin to continue killing germs even after washing. Please DO NOT use if you have an allergy to CHG or antibacterial soaps.  If your skin becomes reddened/irritated stop using the CHG and inform your nurse when you arrive at Short Stay. Do not shave (including legs and underarms) for at least 48 hours prior to the first CHG shower.  You may shave your face/neck. Please follow these instructions carefully:  1.  Shower with CHG Soap the night before surgery and the  morning of Surgery.  2.  If you choose to wash  your hair, wash your hair first as usual with your  normal  shampoo.  3.  After you shampoo, rinse your hair and body thoroughly to remove the  shampoo.                           4.  Use CHG as you would any other liquid soap.  You can apply chg directly  to the skin and wash                       Gently with a scrungie or clean washcloth.  5.  Apply the CHG Soap to your body ONLY FROM THE NECK DOWN.   Do not use on face/ open                           Wound or open sores. Avoid contact with eyes, ears mouth and genitals (private parts).                       Wash face,  Genitals (private parts) with your normal soap.             6.  Wash thoroughly, paying special attention to the area where your surgery  will be performed.  7.  Thoroughly rinse your body with warm water from the neck down.  8.  DO  NOT shower/wash with your normal soap after using and rinsing off  the CHG Soap.                9.  Pat yourself dry with a clean towel.            10.  Wear clean pajamas.            11.  Place clean sheets on your bed the night of your first shower and do not  sleep with pets. Day of Surgery : Do not apply any lotions/deodorants the morning of surgery.  Please wear clean clothes to the hospital/surgery center.  FAILURE TO FOLLOW THESE INSTRUCTIONS MAY RESULT IN THE CANCELLATION OF YOUR SURGERY PATIENT SIGNATURE_________________________________  NURSE SIGNATURE__________________________________  ________________________________________________________________________

## 2022-08-02 ENCOUNTER — Other Ambulatory Visit: Payer: Self-pay

## 2022-08-02 ENCOUNTER — Encounter (HOSPITAL_COMMUNITY): Payer: Self-pay

## 2022-08-02 ENCOUNTER — Encounter (HOSPITAL_COMMUNITY)
Admission: RE | Admit: 2022-08-02 | Discharge: 2022-08-02 | Disposition: A | Discharge status: Home / Self Care | Payer: Managed Care, Other (non HMO) | Source: Ambulatory Visit | Attending: Orthopedic Surgery | Admitting: Orthopedic Surgery

## 2022-08-02 VITALS — BP 107/72 | HR 61 | Temp 97.9°F | Ht 72.0 in | Wt 184.0 lb

## 2022-08-02 DIAGNOSIS — Z01818 Encounter for other preprocedural examination: Secondary | ICD-10-CM | POA: Insufficient documentation

## 2022-08-02 LAB — CBC
HCT: 42.8 % (ref 39.0–52.0)
Hemoglobin: 14.5 g/dL (ref 13.0–17.0)
MCH: 32.3 pg (ref 26.0–34.0)
MCHC: 33.9 g/dL (ref 30.0–36.0)
MCV: 95.3 fL (ref 80.0–100.0)
Platelets: 202 10*3/uL (ref 150–400)
RBC: 4.49 MIL/uL (ref 4.22–5.81)
RDW: 12 % (ref 11.5–15.5)
WBC: 4.3 10*3/uL (ref 4.0–10.5)
nRBC: 0 % (ref 0.0–0.2)

## 2022-08-02 NOTE — Progress Notes (Signed)
For Short Stay: COVID SWAB appointment date:  Bowel Prep reminder:   For Anesthesia: PCP - Brooke Hester:NP Cardiologist - N/A  Chest x-ray - 05/03/22 EKG - 05/06/22 Stress Test -  ECHO -  Cardiac Cath -  Pacemaker/ICD device last checked: Pacemaker orders received: Device Rep notified:  Spinal Cord Stimulator: N/A  Sleep Study - N/A CPAP -   Fasting Blood Sugar -N/A  Checks Blood Sugar _____ times a day Date and result of last Hgb A1c-  Last dose of GLP1 agonist- N/A GLP1 instructions:   Last dose of SGLT-2 inhibitors- N/A SGLT-2 instructions:   Blood Thinner Instructions: N/A Aspirin Instructions: Last Dose:  Activity level: Can go up a flight of stairs and activities of daily living without stopping and without chest pain and/or shortness of breath   Able to exercise without chest pain and/or shortness of breath  Anesthesia review: Hx: Seizures(last a month in a half ago),Smoker.  Patient denies shortness of breath, fever, cough and chest pain at PAT appointment   Patient verbalized understanding of instructions that were given to them at the PAT appointment. Patient was also instructed that they will need to review over the PAT instructions again at home before surgery.

## 2022-08-06 ENCOUNTER — Encounter (HOSPITAL_COMMUNITY): Payer: Self-pay | Admitting: Orthopedic Surgery

## 2022-08-06 ENCOUNTER — Ambulatory Visit (HOSPITAL_COMMUNITY): Payer: Managed Care, Other (non HMO) | Admitting: Anesthesiology

## 2022-08-06 ENCOUNTER — Encounter (HOSPITAL_COMMUNITY)
Admission: RE | Disposition: A | Discharge status: Home / Self Care | Payer: Self-pay | Source: Home / Self Care | Attending: Orthopedic Surgery

## 2022-08-06 ENCOUNTER — Ambulatory Visit (HOSPITAL_COMMUNITY)
Admission: RE | Admit: 2022-08-06 | Discharge: 2022-08-06 | Disposition: A | Discharge status: Home / Self Care | Payer: Managed Care, Other (non HMO) | Attending: Orthopedic Surgery | Admitting: Orthopedic Surgery

## 2022-08-06 ENCOUNTER — Other Ambulatory Visit: Payer: Self-pay

## 2022-08-06 DIAGNOSIS — S43432A Superior glenoid labrum lesion of left shoulder, initial encounter: Secondary | ICD-10-CM

## 2022-08-06 DIAGNOSIS — M71312 Other bursal cyst, left shoulder: Secondary | ICD-10-CM | POA: Diagnosis not present

## 2022-08-06 DIAGNOSIS — R569 Unspecified convulsions: Secondary | ICD-10-CM | POA: Diagnosis not present

## 2022-08-06 DIAGNOSIS — Z87891 Personal history of nicotine dependence: Secondary | ICD-10-CM | POA: Diagnosis not present

## 2022-08-06 HISTORY — PX: SHOULDER ARTHROSCOPY: SHX128

## 2022-08-06 SURGERY — ARTHROSCOPY, SHOULDER
Anesthesia: General | Site: Shoulder | Laterality: Left

## 2022-08-06 MED ORDER — AMISULPRIDE (ANTIEMETIC) 5 MG/2ML IV SOLN: 10.0000 mg | Freq: Once | INTRAVENOUS | Status: DC | PRN

## 2022-08-06 MED ORDER — ACETAMINOPHEN 325 MG PO TABS: 325.0000 mg | ORAL_TABLET | ORAL | Status: DC | PRN

## 2022-08-06 MED ORDER — BUPIVACAINE HCL (PF) 0.5 % IJ SOLN
INTRAMUSCULAR | Status: DC | PRN
  Administered 2022-08-06: 15 mL via PERINEURAL

## 2022-08-06 MED ORDER — OXYCODONE HCL 5 MG PO TABS: 5.0000 mg | ORAL_TABLET | ORAL | 0 refills | Status: DC | PRN

## 2022-08-06 MED ORDER — BUPIVACAINE LIPOSOME 1.3 % IJ SUSP
INTRAMUSCULAR | Status: DC | PRN
  Administered 2022-08-06: 10 mL via PERINEURAL

## 2022-08-06 MED ORDER — METHOCARBAMOL 500 MG PO TABS: 500.0000 mg | ORAL_TABLET | Freq: Four times a day (QID) | ORAL | 0 refills | Status: DC | PRN

## 2022-08-06 MED ORDER — OXYCODONE HCL 5 MG/5ML PO SOLN: 5.0000 mg | Freq: Once | ORAL | Status: DC | PRN

## 2022-08-06 MED ORDER — FENTANYL CITRATE (PF) 100 MCG/2ML IJ SOLN
INTRAMUSCULAR | Status: AC
  Filled 2022-08-06: qty 2

## 2022-08-06 MED ORDER — SUGAMMADEX SODIUM 200 MG/2ML IV SOLN
INTRAVENOUS | Status: DC | PRN
  Administered 2022-08-06: 200 mg via INTRAVENOUS

## 2022-08-06 MED ORDER — LIDOCAINE 2% (20 MG/ML) 5 ML SYRINGE
INTRAMUSCULAR | Status: DC | PRN
  Administered 2022-08-06: 40 mg via INTRAVENOUS

## 2022-08-06 MED ORDER — ORAL CARE MOUTH RINSE: 15.0000 mL | Freq: Once | OROMUCOSAL | Status: AC

## 2022-08-06 MED ORDER — PHENYLEPHRINE HCL (PRESSORS) 10 MG/ML IV SOLN
INTRAVENOUS | Status: AC
  Filled 2022-08-06: qty 1

## 2022-08-06 MED ORDER — LACTATED RINGERS IV SOLN: INTRAVENOUS | Status: DC

## 2022-08-06 MED ORDER — ROCURONIUM BROMIDE 10 MG/ML (PF) SYRINGE
PREFILLED_SYRINGE | INTRAVENOUS | Status: DC | PRN
  Administered 2022-08-06: 40 mg via INTRAVENOUS

## 2022-08-06 MED ORDER — MIDAZOLAM HCL 2 MG/2ML IJ SOLN
INTRAMUSCULAR | Status: AC
  Filled 2022-08-06: qty 2

## 2022-08-06 MED ORDER — FENTANYL CITRATE PF 50 MCG/ML IJ SOSY: 25.0000 ug | PREFILLED_SYRINGE | INTRAMUSCULAR | Status: DC | PRN

## 2022-08-06 MED ORDER — CEFAZOLIN SODIUM-DEXTROSE 2-4 GM/100ML-% IV SOLN
2.0000 g | INTRAVENOUS | Status: AC
  Administered 2022-08-06: 2 g via INTRAVENOUS
  Filled 2022-08-06: qty 100

## 2022-08-06 MED ORDER — ACETAMINOPHEN 10 MG/ML IV SOLN: 1000.0000 mg | Freq: Once | INTRAVENOUS | Status: DC | PRN

## 2022-08-06 MED ORDER — FENTANYL CITRATE PF 50 MCG/ML IJ SOSY
50.0000 ug | PREFILLED_SYRINGE | Freq: Once | INTRAMUSCULAR | Status: DC
  Filled 2022-08-06: qty 2

## 2022-08-06 MED ORDER — ONDANSETRON HCL 4 MG/2ML IJ SOLN
INTRAMUSCULAR | Status: DC | PRN
  Administered 2022-08-06: 4 mg via INTRAVENOUS

## 2022-08-06 MED ORDER — CHLORHEXIDINE GLUCONATE 0.12 % MT SOLN
15.0000 mL | Freq: Once | OROMUCOSAL | Status: AC
  Administered 2022-08-06: 15 mL via OROMUCOSAL

## 2022-08-06 MED ORDER — PROPOFOL 10 MG/ML IV BOLUS
INTRAVENOUS | Status: DC | PRN
  Administered 2022-08-06: 200 mg via INTRAVENOUS

## 2022-08-06 MED ORDER — OXYCODONE HCL 5 MG PO TABS: 5.0000 mg | ORAL_TABLET | Freq: Once | ORAL | Status: DC | PRN

## 2022-08-06 MED ORDER — DEXAMETHASONE SODIUM PHOSPHATE 10 MG/ML IJ SOLN
INTRAMUSCULAR | Status: DC | PRN
  Administered 2022-08-06: 10 mg via INTRAVENOUS

## 2022-08-06 MED ORDER — SODIUM CHLORIDE 0.9 % IR SOLN
Status: DC | PRN
  Administered 2022-08-06: 9000 mL

## 2022-08-06 MED ORDER — MIDAZOLAM HCL 2 MG/2ML IJ SOLN
1.0000 mg | Freq: Once | INTRAMUSCULAR | Status: AC
  Administered 2022-08-06: 2 mg via INTRAVENOUS
  Filled 2022-08-06: qty 2

## 2022-08-06 MED ORDER — MIDAZOLAM HCL 5 MG/5ML IJ SOLN
INTRAMUSCULAR | Status: DC | PRN
  Administered 2022-08-06 (×2): 1 mg via INTRAVENOUS

## 2022-08-06 MED ORDER — PROMETHAZINE HCL 25 MG/ML IJ SOLN: 6.2500 mg | INTRAMUSCULAR | Status: DC | PRN

## 2022-08-06 MED ORDER — 0.9 % SODIUM CHLORIDE (POUR BTL) OPTIME
TOPICAL | Status: DC | PRN
  Administered 2022-08-06: 1000 mL

## 2022-08-06 MED ORDER — ACETAMINOPHEN 160 MG/5ML PO SOLN: 325.0000 mg | ORAL | Status: DC | PRN

## 2022-08-06 SURGICAL SUPPLY — 69 items
AID PSTN UNV HD RSTRNT DISP (MISCELLANEOUS)
ANCH SUT 2 FBRTK KNTLS 1.8 (Anchor) ×8 IMPLANT
ANCH SUT SHRT 12.5 CANN EYLT (Anchor) IMPLANT
ANCH SUT SWLK 19.1X4.75 VT (Anchor) IMPLANT
ANCHOR SUT 1.8 FIBERTAK SB KL (Anchor) IMPLANT
BAG COUNTER SPONGE SURGICOUNT (BAG) IMPLANT
BAG SPNG CNTER NS LX DISP (BAG)
BOOTIES KNEE HIGH SLOAN (MISCELLANEOUS) ×2 IMPLANT
BURR OVAL 8 FLU 4.0X13 (MISCELLANEOUS) ×1 IMPLANT
CANNULA 5.75X7 CRYSTAL CLEAR (CANNULA) ×1 IMPLANT
CANNULA TWIST IN 8.25X7CM (CANNULA) IMPLANT
COOLER ICEMAN CLASSIC (MISCELLANEOUS) IMPLANT
COVER SURGICAL LIGHT HANDLE (MISCELLANEOUS) ×1 IMPLANT
CUTTER BONE 4.0MM X 13CM (MISCELLANEOUS) ×1 IMPLANT
DISSECTOR 3.8MM X 13CM (MISCELLANEOUS) IMPLANT
DRAPE FOOT SWITCH (DRAPES) ×1 IMPLANT
DRAPE IMP U-DRAPE 54X76 (DRAPES) ×1 IMPLANT
DRAPE INCISE IOBAN 66X45 STRL (DRAPES) IMPLANT
DRAPE ORTHO SPLIT 77X108 STRL (DRAPES) ×2
DRAPE STERI 35X30 U-POUCH (DRAPES) ×1 IMPLANT
DRAPE SURG 17X23 STRL (DRAPES) ×2 IMPLANT
DRAPE SURG ORHT 6 SPLT 77X108 (DRAPES) ×2 IMPLANT
DRAPE U-SHAPE 47X51 STRL (DRAPES) ×1 IMPLANT
DURAPREP 26ML APPLICATOR (WOUND CARE) ×1 IMPLANT
ELECT REM PT RETURN 15FT ADLT (MISCELLANEOUS) ×1 IMPLANT
GAUZE PAD ABD 7.5X8 STRL (GAUZE/BANDAGES/DRESSINGS) IMPLANT
GAUZE PAD ABD 8X10 STRL (GAUZE/BANDAGES/DRESSINGS) ×2 IMPLANT
GAUZE SPONGE 4X4 12PLY STRL (GAUZE/BANDAGES/DRESSINGS) ×1 IMPLANT
GAUZE XEROFORM 1X8 LF (GAUZE/BANDAGES/DRESSINGS) ×1 IMPLANT
GLOVE BIO SURGEON STRL SZ7.5 (GLOVE) ×1 IMPLANT
GLOVE BIOGEL PI IND STRL 6.5 (GLOVE) ×1 IMPLANT
GLOVE BIOGEL PI IND STRL 8 (GLOVE) ×1 IMPLANT
GLOVE SURG POLYISO LF SZ6.5 (GLOVE) ×1 IMPLANT
GOWN STRL REUS W/ TWL LRG LVL3 (GOWN DISPOSABLE) ×1 IMPLANT
GOWN STRL REUS W/ TWL XL LVL3 (GOWN DISPOSABLE) ×1 IMPLANT
GOWN STRL REUS W/TWL LRG LVL3 (GOWN DISPOSABLE) ×1
GOWN STRL REUS W/TWL XL LVL3 (GOWN DISPOSABLE) ×1
KIT BASIN OR (CUSTOM PROCEDURE TRAY) ×1 IMPLANT
KIT CVD SPEAR FBRTK 1.8 DRILL (KITS) IMPLANT
KIT PUSHLOCK 2.9 HIP (KITS) IMPLANT
KIT TURNOVER KIT A (KITS) IMPLANT
LASSO 90 CVE QUICKPAS (DISPOSABLE) IMPLANT
LASSO CRESCENT QUICKPASS (SUTURE) IMPLANT
MANIFOLD NEPTUNE II (INSTRUMENTS) ×1 IMPLANT
NDL SCORPION MULTI FIRE (NEEDLE) IMPLANT
NEEDLE SCORPION MULTI FIRE (NEEDLE) IMPLANT
PACK ARTHROSCOPY WL (CUSTOM PROCEDURE TRAY) ×1 IMPLANT
PAD COLD SHLDR WRAP-ON (PAD) IMPLANT
PROBE BIPOLAR ATHRO 135MM 90D (MISCELLANEOUS) ×1 IMPLANT
PROTECTOR NERVE ULNAR (MISCELLANEOUS) ×1 IMPLANT
RESTRAINT HEAD UNIVERSAL NS (MISCELLANEOUS) IMPLANT
SLING ARM FOAM STRAP LRG (SOFTGOODS) IMPLANT
SLING ARM FOAM STRAP MED (SOFTGOODS) IMPLANT
SLING ARM IMMOBILIZER LRG (SOFTGOODS) IMPLANT
SLING ARM IMMOBILIZER MED (SOFTGOODS) IMPLANT
SUPPORT WRAP ARM LG (MISCELLANEOUS) ×1 IMPLANT
SUT ETHILON 3 0 PS 1 (SUTURE) ×1 IMPLANT
SUT PDS AB 1 CT1 27 (SUTURE) IMPLANT
SUT TIGER TAPE 7 IN WHITE (SUTURE) IMPLANT
SUTURE TAPE 1.3 40 TPR END (SUTURE) IMPLANT
SUTURETAPE 1.3 40 TPR END (SUTURE)
TAPE CLOTH SURG 6X10 WHT LF (GAUZE/BANDAGES/DRESSINGS) IMPLANT
TAPE FIBER 2MM 7IN #2 BLUE (SUTURE) IMPLANT
TAPE LABRALWHITE 1.5X36 (TAPE) IMPLANT
TAPE SUT LABRALTAP WHT/BLK (SUTURE) IMPLANT
TOWEL OR 17X26 10 PK STRL BLUE (TOWEL DISPOSABLE) ×1 IMPLANT
TOWEL OR NON WOVEN STRL DISP B (DISPOSABLE) ×1 IMPLANT
TUBING ARTHROSCOPY IRRIG 16FT (MISCELLANEOUS) ×1 IMPLANT
TUBING CONNECTING 10 (TUBING) ×2 IMPLANT

## 2022-08-06 NOTE — Discharge Instructions (Addendum)

## 2022-08-06 NOTE — Anesthesia Procedure Notes (Signed)
Anesthesia Regional Block: Interscalene brachial plexus block   Pre-Anesthetic Checklist: , timeout performed,  Correct Patient, Correct Site, Correct Laterality,  Correct Procedure, Correct Position, site marked,  Risks and benefits discussed,  Surgical consent,  Pre-op evaluation,  At surgeon's request and post-op pain management  Laterality: Left  Prep: chloraprep       Needles:  Injection technique: Single-shot  Needle Type: Echogenic Stimulator Needle     Needle Length: 9cm  Needle Gauge: 21     Additional Needles:   Procedures:,,,, ultrasound used (permanent image in chart),,    Narrative:  Start time: 08/06/2022 2:50 PM End time: 08/06/2022 3:00 PM Injection made incrementally with aspirations every 5 mL.  Performed by: Personally  Anesthesiologist: Leonides Grills, MD  Additional Notes: Functioning IV was confirmed and monitors were applied.  A timeout was performed. Sterile prep, hand hygiene and sterile gloves were used. A 90mm 21ga Arrow echogenic stimulator needle was used. Negative aspiration and negative test dose prior to incremental administration of local anesthetic. The patient tolerated the procedure well.  Ultrasound guidance: relevent anatomy identified, needle position confirmed, local anesthetic spread visualized around nerve(s), vascular puncture avoided.  Image printed for medical record.
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
## 2022-08-06 NOTE — Anesthesia Postprocedure Evaluation (Signed)
Anesthesia Post Note  Patient: Harold Ellis  Procedure(s) Performed: LEFT ARTHROSCOPY SHOULDER WITH LABRAL REPAIR AND DECOMPRESSION OF PARALABRAL CYST (Left: Shoulder)     Patient location during evaluation: PACU Anesthesia Type: General and Regional Level of consciousness: awake Pain management: pain level controlled Vital Signs Assessment: post-procedure vital signs reviewed and stable Respiratory status: spontaneous breathing, nonlabored ventilation and respiratory function stable Cardiovascular status: blood pressure returned to baseline and stable Postop Assessment: no apparent nausea or vomiting Anesthetic complications: no   No notable events documented.  Last Vitals:  Vitals:   08/06/22 1750 08/06/22 1815  BP: 128/85   Pulse: 64 65  Resp: 16   Temp:    SpO2: 93%     Last Pain:  Vitals:   08/06/22 1750  TempSrc:   PainSc: 0-No pain                 Keola Heninger P [place]

## 2022-08-06 NOTE — Anesthesia Procedure Notes (Signed)
Procedure Name: Intubation Date/Time: 08/06/2022 3:22 PM  Performed by: Ponciano Ort, CRNAPre-anesthesia Checklist: Patient identified, Emergency Drugs available, Suction available and Patient being monitored Patient Re-evaluated:Patient Re-evaluated prior to induction Oxygen Delivery Method: Circle system utilized Preoxygenation: Pre-oxygenation with 100% oxygen Induction Type: IV induction Ventilation: Mask ventilation without difficulty Laryngoscope Size: Glidescope and 3 Grade View: Grade I Tube type: Oral Tube size: 7.5 mm Number of attempts: 1 Airway Equipment and Method: Stylet and Oral airway Placement Confirmation: ETT inserted through vocal cords under direct vision, positive ETCO2 and breath sounds checked- equal and bilateral Secured at: 23 cm Tube secured with: Tape Dental Injury: Teeth and Oropharynx as per pre-operative assessment

## 2022-08-06 NOTE — Transfer of Care (Signed)
Immediate Anesthesia Transfer of Care Note  Patient: Cavanaugh Butzlaff  Procedure(s) Performed: LEFT ARTHROSCOPY SHOULDER WITH LABRAL REPAIR AND DECOMPRESSION OF PARALABRAL CYST (Left: Shoulder)  Patient Location: PACU  Anesthesia Type:Regional and GA combined with regional for post-op pain  Level of Consciousness: awake, alert , oriented, and patient cooperative  Airway & Oxygen Therapy: Patient Spontanous Breathing and Patient connected to face mask oxygen  Post-op Assessment: Report given to RN and Post -op Vital signs reviewed and stable  Post vital signs: Reviewed and stable  Last Vitals:  Vitals Value Taken Time  BP 144/94 08/06/22 1700  Temp 36 C 08/06/22 1700  Pulse 62 08/06/22 1701  Resp 18 08/06/22 1701  SpO2 95 % 08/06/22 1701  Vitals shown include unvalidated device data.  Last Pain:  Vitals:   08/06/22 1505  TempSrc:   PainSc: 0-No pain         Complications: No notable events documented.
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
## 2022-08-06 NOTE — Anesthesia Preprocedure Evaluation (Addendum)
Anesthesia Evaluation  Patient identified by MRN, date of birth, ID band Patient awake    Reviewed: Allergy & Precautions, NPO status , Patient's Chart, lab work & pertinent test results  Airway Mallampati: II  TM Distance: >3 FB Neck ROM: Full  Mouth opening: Limited Mouth Opening  Dental  (+) Teeth Intact, Dental Advisory Given, Missing   Pulmonary former smoker   breath sounds clear to auscultation       Cardiovascular negative cardio ROS  Rhythm:Regular Rate:Normal     Neuro/Psych Seizures -,   negative psych ROS   GI/Hepatic negative GI ROS, Neg liver ROS,,,  Endo/Other  negative endocrine ROS    Renal/GU negative Renal ROS     Musculoskeletal negative musculoskeletal ROS (+)    Abdominal   Peds  Hematology negative hematology ROS (+)   Anesthesia Other Findings - last seizure one month ago  Reproductive/Obstetrics                             Anesthesia Physical Anesthesia Plan  ASA: 2  Anesthesia Plan: General   Post-op Pain Management: Regional block*   Induction: Intravenous  PONV Risk Score and Plan: 3 and Ondansetron, Dexamethasone and Midazolam  Airway Management Planned: Oral ETT and Video Laryngoscope Planned  Additional Equipment: None  Intra-op Plan:   Post-operative Plan: Extubation in OR  Informed Consent: I have reviewed the patients History and Physical, chart, labs and discussed the procedure including the risks, benefits and alternatives for the proposed anesthesia with the patient or authorized representative who has indicated his/her understanding and acceptance.     Dental advisory given  Plan Discussed with: CRNA  Anesthesia Plan Comments: (Glidescope due to limited mouth opening)       Anesthesia Quick Evaluation

## 2022-08-12 ENCOUNTER — Encounter (HOSPITAL_COMMUNITY): Payer: Self-pay | Admitting: Orthopedic Surgery

## 2022-09-16 ENCOUNTER — Other Ambulatory Visit: Payer: Self-pay

## 2022-09-16 ENCOUNTER — Encounter: Payer: Self-pay | Admitting: Physical Therapy

## 2022-09-16 ENCOUNTER — Ambulatory Visit: Payer: Managed Care, Other (non HMO) | Attending: Orthopedic Surgery | Admitting: Physical Therapy

## 2022-09-16 DIAGNOSIS — M25512 Pain in left shoulder: Secondary | ICD-10-CM | POA: Diagnosis present

## 2022-09-16 DIAGNOSIS — M25612 Stiffness of left shoulder, not elsewhere classified: Secondary | ICD-10-CM | POA: Diagnosis present
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
## 2022-09-19 ENCOUNTER — Ambulatory Visit: Payer: Managed Care, Other (non HMO)

## 2022-09-19 DIAGNOSIS — M25512 Pain in left shoulder: Secondary | ICD-10-CM | POA: Diagnosis not present
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
## 2022-09-24 ENCOUNTER — Ambulatory Visit: Payer: Managed Care, Other (non HMO) | Admitting: Physical Therapy

## 2022-09-24 DIAGNOSIS — M25612 Stiffness of left shoulder, not elsewhere classified: Secondary | ICD-10-CM

## 2022-09-24 DIAGNOSIS — M25512 Pain in left shoulder: Secondary | ICD-10-CM | POA: Diagnosis not present
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
## 2022-09-26 ENCOUNTER — Ambulatory Visit: Payer: Managed Care, Other (non HMO)

## 2022-09-26 DIAGNOSIS — M25512 Pain in left shoulder: Secondary | ICD-10-CM

## 2022-09-26 DIAGNOSIS — M25612 Stiffness of left shoulder, not elsewhere classified: Secondary | ICD-10-CM
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
## 2022-10-01 ENCOUNTER — Encounter: Payer: Self-pay | Admitting: *Deleted

## 2022-10-01 ENCOUNTER — Ambulatory Visit: Payer: Managed Care, Other (non HMO) | Admitting: *Deleted

## 2022-10-01 DIAGNOSIS — M25512 Pain in left shoulder: Secondary | ICD-10-CM

## 2022-10-01 DIAGNOSIS — M25612 Stiffness of left shoulder, not elsewhere classified: Secondary | ICD-10-CM
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
## 2022-10-03 ENCOUNTER — Other Ambulatory Visit: Payer: Self-pay | Admitting: Neurosurgery

## 2022-10-03 ENCOUNTER — Encounter: Payer: Self-pay | Admitting: Physical Therapy

## 2022-10-03 ENCOUNTER — Ambulatory Visit: Payer: Managed Care, Other (non HMO) | Admitting: Physical Therapy

## 2022-10-03 DIAGNOSIS — M25612 Stiffness of left shoulder, not elsewhere classified: Secondary | ICD-10-CM

## 2022-10-03 DIAGNOSIS — M25512 Pain in left shoulder: Secondary | ICD-10-CM | POA: Diagnosis not present
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
## 2022-10-08 ENCOUNTER — Ambulatory Visit: Payer: Managed Care, Other (non HMO) | Attending: Orthopedic Surgery | Admitting: Physical Therapy

## 2022-10-08 ENCOUNTER — Encounter: Payer: Self-pay | Admitting: Physical Therapy

## 2022-10-08 DIAGNOSIS — M25512 Pain in left shoulder: Secondary | ICD-10-CM | POA: Diagnosis present

## 2022-10-08 DIAGNOSIS — M25612 Stiffness of left shoulder, not elsewhere classified: Secondary | ICD-10-CM | POA: Diagnosis present
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
## 2022-10-10 ENCOUNTER — Ambulatory Visit: Payer: Managed Care, Other (non HMO)

## 2022-10-10 DIAGNOSIS — M25612 Stiffness of left shoulder, not elsewhere classified: Secondary | ICD-10-CM

## 2022-10-10 DIAGNOSIS — M25512 Pain in left shoulder: Secondary | ICD-10-CM
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
## 2022-10-14 ENCOUNTER — Other Ambulatory Visit: Payer: Self-pay | Admitting: Neurosurgery

## 2022-10-15 ENCOUNTER — Encounter: Payer: Self-pay | Admitting: *Deleted

## 2022-10-15 ENCOUNTER — Ambulatory Visit: Payer: Managed Care, Other (non HMO) | Admitting: *Deleted

## 2022-10-15 DIAGNOSIS — M25512 Pain in left shoulder: Secondary | ICD-10-CM | POA: Diagnosis not present

## 2022-10-15 DIAGNOSIS — M25612 Stiffness of left shoulder, not elsewhere classified: Secondary | ICD-10-CM
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
## 2022-10-17 ENCOUNTER — Ambulatory Visit: Payer: Managed Care, Other (non HMO) | Admitting: Physical Therapy

## 2022-10-17 DIAGNOSIS — M25512 Pain in left shoulder: Secondary | ICD-10-CM | POA: Diagnosis not present

## 2022-10-17 DIAGNOSIS — M25612 Stiffness of left shoulder, not elsewhere classified: Secondary | ICD-10-CM
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
## 2022-10-18 NOTE — Pre-Procedure Instructions (Signed)
Surgical Instructions   Your procedure is scheduled on October 29, 2022. Report to [facility] Main Entrance "A" at 9:30 A.M., then check in with the Admitting office. Any questions or running late day of surgery: call [phone]  Questions prior to your surgery date: call [phone], Monday-Friday, 8am-4pm. If you experience any cold or flu symptoms such as cough, fever, chills, shortness of breath, etc. between now and your scheduled surgery, please notify us at the above number.     Remember:  Do not eat or drink after midnight the night before your surgery    Take these medicines the morning of surgery with A SIP OF WATER: Cenobamate (XCOPRI)  VIMPAT    One week prior to surgery, STOP taking any Aspirin (unless otherwise instructed by your surgeon) Aleve, Naproxen, Ibuprofen, Motrin, Advil, Goody's, BC's, all herbal medications, fish oil, and non-prescription vitamins.                     Do NOT Smoke (Tobacco/Vaping) for 24 hours prior to your procedure.  If you use a CPAP at night, you may bring your mask/headgear for your overnight stay.   You will be asked to remove any contacts, glasses, piercing's, hearing aid's, dentures/partials prior to surgery. Please bring cases for these items if needed.    Patients discharged the day of surgery will not be allowed to drive home, and someone needs to stay with them for 24 hours.  SURGICAL WAITING ROOM VISITATION Patients may have no more than 2 support people in the waiting area - these visitors may rotate.   Pre-op nurse will coordinate an appropriate time for 1 ADULT support person, who may not rotate, to accompany patient in pre-op.  Children under the age of 68 must have an adult with them who is not the patient and must remain in the main waiting area with an adult.  If the patient needs to stay at the hospital during part of their recovery, the visitor guidelines for inpatient rooms apply.  Please refer to the [facility] website for the visitor guidelines for any additional information.   If you received a COVID test during your pre-op visit  it is requested that you wear a mask when out in public, stay away from anyone that may not be feeling well and notify your surgeon if you develop symptoms. If you have been in contact with anyone that has tested positive in the last 10 days please notify you surgeon.      Pre-operative CHG Bathing Instructions   You can play a key role in reducing the risk of infection after surgery. Your skin needs to be as free of germs as possible. You can reduce the number of germs on your skin by washing with CHG (chlorhexidine gluconate) soap before surgery. CHG is an antiseptic soap that kills germs and continues to kill germs even after washing.   DO NOT use if you have an allergy to chlorhexidine/CHG or antibacterial soaps. If your skin becomes reddened or irritated, stop using the CHG and notify one of our RNs at [phone].              TAKE A SHOWER THE NIGHT BEFORE SURGERY AND THE DAY OF SURGERY    Please keep in mind the following:  DO NOT shave, including legs and underarms, 48 hours prior to surgery.   You may shave your face before/day of surgery.  Place clean sheets on your bed the night before  surgery Use a clean washcloth (not used since being washed) for each shower. DO NOT sleep with pet's night before surgery.  CHG Shower Instructions:  If you choose to wash your hair and private area, wash first with your normal shampoo/soap.  After you use shampoo/soap, rinse your hair and body thoroughly to remove shampoo/soap residue.  Turn the water OFF and apply half the bottle of CHG soap to a CLEAN washcloth.  Apply CHG soap ONLY FROM YOUR NECK DOWN TO YOUR TOES (washing for 3-5 minutes)  DO NOT use CHG soap on face, private areas, open wounds, or sores.  Pay special attention to the area where your surgery is being performed.  If you are having back  surgery, having someone wash your back for you may be helpful. Wait 2 minutes after CHG soap is applied, then you may rinse off the CHG soap.  Pat dry with a clean towel  Put on clean pajamas    Additional instructions for the day of surgery: DO NOT APPLY any lotions, deodorants, cologne, or perfumes.   Do not wear jewelry or makeup Do not wear nail polish, gel polish, artificial nails, or any other type of covering on natural nails (fingers and toes) Do not bring valuables to the hospital. [facility] is not responsible for valuables/personal belongings. Put on clean/comfortable clothes.  Please brush your teeth.  Ask your nurse before applying any prescription medications to the skin.

## 2022-10-21 ENCOUNTER — Encounter (HOSPITAL_COMMUNITY)
Admission: RE | Admit: 2022-10-21 | Discharge: 2022-10-21 | Disposition: A | Discharge status: Home / Self Care | Payer: Managed Care, Other (non HMO) | Source: Ambulatory Visit | Attending: Neurosurgery | Admitting: Neurosurgery

## 2022-10-21 ENCOUNTER — Encounter (HOSPITAL_COMMUNITY): Payer: Self-pay

## 2022-10-21 ENCOUNTER — Other Ambulatory Visit: Payer: Self-pay

## 2022-10-21 VITALS — BP 114/71 | HR 86 | Temp 98.2°F | Resp 18 | Ht 72.0 in | Wt 193.2 lb

## 2022-10-21 DIAGNOSIS — Z01818 Encounter for other preprocedural examination: Secondary | ICD-10-CM | POA: Diagnosis present

## 2022-10-21 DIAGNOSIS — G988 Other disorders of nervous system: Secondary | ICD-10-CM | POA: Insufficient documentation

## 2022-10-21 DIAGNOSIS — Z01812 Encounter for preprocedural laboratory examination: Secondary | ICD-10-CM | POA: Diagnosis not present

## 2022-10-21 LAB — CBC
HCT: 44.6 % (ref 39.0–52.0)
Hemoglobin: 15 g/dL (ref 13.0–17.0)
MCH: 32.4 pg (ref 26.0–34.0)
MCHC: 33.6 g/dL (ref 30.0–36.0)
MCV: 96.3 fL (ref 80.0–100.0)
Platelets: 220 10*3/uL (ref 150–400)
RBC: 4.63 MIL/uL (ref 4.22–5.81)
RDW: 12.4 % (ref 11.5–15.5)
WBC: 7.8 10*3/uL (ref 4.0–10.5)
nRBC: 0 % (ref 0.0–0.2)

## 2022-10-21 LAB — BASIC METABOLIC PANEL
Anion gap: 11 (ref 5–15)
BUN: 17 mg/dL (ref 6–20)
CO2: 22 mmol/L (ref 22–32)
Calcium: 8.8 mg/dL — ABNORMAL LOW (ref 8.9–10.3)
Chloride: 104 mmol/L (ref 98–111)
Creatinine, Ser: 1.1 mg/dL (ref 0.61–1.24)
GFR, Estimated: >60 mL/min (ref >60)
Glucose, Bld: 112 mg/dL — ABNORMAL HIGH (ref 70–99)
Potassium: 4.1 mmol/L (ref 3.5–5.1)
Sodium: 137 mmol/L (ref 135–145)

## 2022-10-21 NOTE — Progress Notes (Signed)
PCP - Dr. Carilyn Goodpasture Cardiologist - DENIES Neurologist - Dr. Sherral Hammers - seen 1 month ago  Chest x-ray - 05-03-22 EKG - 05-06-22 Stress Test - denies ECHO - Denies Cardiac Cath - Denies  Sleep Study - Denies  Fasting Blood Sugar - Denies being a diabetic  Blood Thinner Instructions: N/A Aspirin Instructions: N/A  ERAS Protcol - NPO  COVID TEST- N/A.     Anesthesia review: N  Patient denies shortness of breath, fever, cough and chest pain at PAT appointment.  Patient denies any respiratory illness / or infection in last 2 months at this time.    All instructions explained to the patient, with a verbal understanding of the material. Patient agrees to go over the instructions while at home for a better understanding. Patient also instructed to self quarantine after being tested for COVID-19. The opportunity to ask questions was provided.

## 2022-10-22 ENCOUNTER — Encounter: Payer: Self-pay | Admitting: Physical Therapy

## 2022-10-22 ENCOUNTER — Ambulatory Visit: Payer: Managed Care, Other (non HMO) | Admitting: Physical Therapy

## 2022-10-22 DIAGNOSIS — M25612 Stiffness of left shoulder, not elsewhere classified: Secondary | ICD-10-CM

## 2022-10-22 DIAGNOSIS — M25512 Pain in left shoulder: Secondary | ICD-10-CM | POA: Diagnosis not present
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
## 2022-10-24 ENCOUNTER — Ambulatory Visit: Payer: Managed Care, Other (non HMO)

## 2022-10-24 DIAGNOSIS — M25512 Pain in left shoulder: Secondary | ICD-10-CM

## 2022-10-24 DIAGNOSIS — M25612 Stiffness of left shoulder, not elsewhere classified: Secondary | ICD-10-CM
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
## 2022-10-28 NOTE — Progress Notes (Signed)
Phone went straight to VM. Left message with updated surgical arrival time of 0900. Attempted to call mother, call could not be completed and unable to leave message. Called other contact Windy Fast, number is no longer in service.

## 2022-10-29 ENCOUNTER — Ambulatory Visit (HOSPITAL_COMMUNITY): Payer: Managed Care, Other (non HMO) | Admitting: Certified Registered Nurse Anesthetist

## 2022-10-29 ENCOUNTER — Encounter (HOSPITAL_COMMUNITY): Payer: Self-pay | Admitting: Neurosurgery

## 2022-10-29 ENCOUNTER — Ambulatory Visit (HOSPITAL_COMMUNITY)
Admission: RE | Admit: 2022-10-29 | Discharge: 2022-10-29 | Disposition: A | Discharge status: Home / Self Care | Payer: Managed Care, Other (non HMO) | Attending: Neurosurgery | Admitting: Neurosurgery

## 2022-10-29 ENCOUNTER — Other Ambulatory Visit: Payer: Self-pay

## 2022-10-29 ENCOUNTER — Ambulatory Visit (HOSPITAL_BASED_OUTPATIENT_CLINIC_OR_DEPARTMENT_OTHER): Payer: Managed Care, Other (non HMO) | Admitting: Certified Registered Nurse Anesthetist

## 2022-10-29 ENCOUNTER — Encounter (HOSPITAL_COMMUNITY)
Admission: RE | Disposition: A | Discharge status: Home / Self Care | Payer: Self-pay | Source: Home / Self Care | Attending: Neurosurgery

## 2022-10-29 DIAGNOSIS — Z79899 Other long term (current) drug therapy: Secondary | ICD-10-CM | POA: Diagnosis not present

## 2022-10-29 DIAGNOSIS — Z87891 Personal history of nicotine dependence: Secondary | ICD-10-CM | POA: Diagnosis not present

## 2022-10-29 DIAGNOSIS — G40219 Localization-related (focal) (partial) symptomatic epilepsy and epileptic syndromes with complex partial seizures, intractable, without status epilepticus: Secondary | ICD-10-CM

## 2022-10-29 DIAGNOSIS — G40A19 Absence epileptic syndrome, intractable, without status epilepticus: Secondary | ICD-10-CM | POA: Diagnosis present

## 2022-10-29 HISTORY — PX: VAGUS NERVE STIMULATOR INSERTION: SHX348

## 2022-10-29 SURGERY — VAGAL NERVE STIMULATOR IMPLANT
Anesthesia: General

## 2022-10-29 MED ORDER — ONDANSETRON HCL 4 MG/2ML IJ SOLN: 4.0000 mg | Freq: Four times a day (QID) | INTRAMUSCULAR | Status: DC | PRN

## 2022-10-29 MED ORDER — OXYCODONE HCL 5 MG/5ML PO SOLN: 5.0000 mg | Freq: Once | ORAL | Status: AC | PRN

## 2022-10-29 MED ORDER — ORAL CARE MOUTH RINSE: 15.0000 mL | Freq: Once | OROMUCOSAL | Status: AC

## 2022-10-29 MED ORDER — LIDOCAINE 2% (20 MG/ML) 5 ML SYRINGE
INTRAMUSCULAR | Status: DC | PRN
  Administered 2022-10-29: 60 mg via INTRAVENOUS

## 2022-10-29 MED ORDER — ROCURONIUM BROMIDE 10 MG/ML (PF) SYRINGE
PREFILLED_SYRINGE | INTRAVENOUS | Status: AC
  Filled 2022-10-29: qty 10

## 2022-10-29 MED ORDER — FENTANYL CITRATE (PF) 100 MCG/2ML IJ SOLN: 25.0000 ug | INTRAMUSCULAR | Status: DC | PRN

## 2022-10-29 MED ORDER — THROMBIN 5000 UNITS EX SOLR
OROMUCOSAL | Status: DC | PRN
  Administered 2022-10-29: 5 mL via TOPICAL

## 2022-10-29 MED ORDER — LIDOCAINE 2% (20 MG/ML) 5 ML SYRINGE
INTRAMUSCULAR | Status: AC
  Filled 2022-10-29: qty 5

## 2022-10-29 MED ORDER — BUPIVACAINE HCL 0.5 % IJ SOLN
INTRAMUSCULAR | Status: DC | PRN
  Administered 2022-10-29: 4 mL
  Administered 2022-10-29: 4.5 mL

## 2022-10-29 MED ORDER — 0.9 % SODIUM CHLORIDE (POUR BTL) OPTIME
TOPICAL | Status: DC | PRN
  Administered 2022-10-29: 1000 mL

## 2022-10-29 MED ORDER — ONDANSETRON HCL 4 MG/2ML IJ SOLN
INTRAMUSCULAR | Status: AC
  Filled 2022-10-29: qty 2

## 2022-10-29 MED ORDER — CEFAZOLIN SODIUM-DEXTROSE 2-4 GM/100ML-% IV SOLN
2.0000 g | INTRAVENOUS | Status: AC
  Administered 2022-10-29: 2 g via INTRAVENOUS
  Filled 2022-10-29: qty 100

## 2022-10-29 MED ORDER — EPHEDRINE SULFATE-NACL 50-0.9 MG/10ML-% IV SOSY
PREFILLED_SYRINGE | INTRAVENOUS | Status: DC | PRN
  Administered 2022-10-29 (×2): 5 mg via INTRAVENOUS

## 2022-10-29 MED ORDER — DEXAMETHASONE SODIUM PHOSPHATE 10 MG/ML IJ SOLN
INTRAMUSCULAR | Status: DC | PRN
  Administered 2022-10-29: 10 mg via INTRAVENOUS

## 2022-10-29 MED ORDER — ONDANSETRON HCL 4 MG/2ML IJ SOLN
INTRAMUSCULAR | Status: DC | PRN
  Administered 2022-10-29: 4 mg via INTRAVENOUS

## 2022-10-29 MED ORDER — SUGAMMADEX SODIUM 200 MG/2ML IV SOLN
INTRAVENOUS | Status: DC | PRN
  Administered 2022-10-29: 200 mg via INTRAVENOUS

## 2022-10-29 MED ORDER — FENTANYL CITRATE (PF) 250 MCG/5ML IJ SOLN
INTRAMUSCULAR | Status: AC
  Filled 2022-10-29: qty 5

## 2022-10-29 MED ORDER — LIDOCAINE-EPINEPHRINE 1 %-1:100000 IJ SOLN
INTRAMUSCULAR | Status: DC | PRN
  Administered 2022-10-29: 4.5 mL
  Administered 2022-10-29: 4 mL

## 2022-10-29 MED ORDER — DEXAMETHASONE SODIUM PHOSPHATE 10 MG/ML IJ SOLN
INTRAMUSCULAR | Status: AC
  Filled 2022-10-29: qty 1

## 2022-10-29 MED ORDER — OXYCODONE HCL 5 MG PO TABS
5.0000 mg | ORAL_TABLET | Freq: Once | ORAL | Status: AC | PRN
  Administered 2022-10-29: 5 mg via ORAL

## 2022-10-29 MED ORDER — PROPOFOL 10 MG/ML IV BOLUS
INTRAVENOUS | Status: AC
  Filled 2022-10-29: qty 20

## 2022-10-29 MED ORDER — ROCURONIUM BROMIDE 10 MG/ML (PF) SYRINGE
PREFILLED_SYRINGE | INTRAVENOUS | Status: DC | PRN
  Administered 2022-10-29: 20 mg via INTRAVENOUS
  Administered 2022-10-29: 50 mg via INTRAVENOUS
  Administered 2022-10-29: 30 mg via INTRAVENOUS
  Administered 2022-10-29: 20 mg via INTRAVENOUS

## 2022-10-29 MED ORDER — CHLORHEXIDINE GLUCONATE 0.12 % MT SOLN
15.0000 mL | Freq: Once | OROMUCOSAL | Status: AC
  Administered 2022-10-29: 15 mL via OROMUCOSAL
  Filled 2022-10-29: qty 15

## 2022-10-29 MED ORDER — HYDROCODONE-ACETAMINOPHEN 5-325 MG PO TABS
1.0000 | ORAL_TABLET | Freq: Four times a day (QID) | ORAL | 0 refills | Status: AC | PRN
Start: 2022-10-29 — End: 2022-11-01

## 2022-10-29 MED ORDER — LIDOCAINE-EPINEPHRINE 1 %-1:100000 IJ SOLN
INTRAMUSCULAR | Status: AC
  Filled 2022-10-29: qty 1

## 2022-10-29 MED ORDER — MIDAZOLAM HCL 2 MG/2ML IJ SOLN
INTRAMUSCULAR | Status: DC | PRN
  Administered 2022-10-29: 2 mg via INTRAVENOUS

## 2022-10-29 MED ORDER — FENTANYL CITRATE (PF) 250 MCG/5ML IJ SOLN
INTRAMUSCULAR | Status: DC | PRN
  Administered 2022-10-29: 50 ug via INTRAVENOUS
  Administered 2022-10-29: 100 ug via INTRAVENOUS

## 2022-10-29 MED ORDER — CHLORHEXIDINE GLUCONATE CLOTH 2 % EX PADS: 6.0000 | MEDICATED_PAD | Freq: Once | CUTANEOUS | Status: DC

## 2022-10-29 MED ORDER — PROPOFOL 10 MG/ML IV BOLUS
INTRAVENOUS | Status: DC | PRN
  Administered 2022-10-29: 140 mg via INTRAVENOUS

## 2022-10-29 MED ORDER — BUPIVACAINE HCL (PF) 0.5 % IJ SOLN
INTRAMUSCULAR | Status: AC
  Filled 2022-10-29: qty 30

## 2022-10-29 MED ORDER — PHENYLEPHRINE HCL-NACL 20-0.9 MG/250ML-% IV SOLN
INTRAVENOUS | Status: DC | PRN
  Administered 2022-10-29: 20 ug/min via INTRAVENOUS

## 2022-10-29 MED ORDER — THROMBIN 5000 UNITS EX SOLR
CUTANEOUS | Status: AC
  Filled 2022-10-29: qty 5000

## 2022-10-29 MED ORDER — LACTATED RINGERS IV SOLN: INTRAVENOUS | Status: DC

## 2022-10-29 MED ORDER — OXYCODONE HCL 5 MG PO TABS
ORAL_TABLET | ORAL | Status: AC
  Filled 2022-10-29: qty 1

## 2022-10-29 MED ORDER — MIDAZOLAM HCL 2 MG/2ML IJ SOLN
INTRAMUSCULAR | Status: AC
  Filled 2022-10-29: qty 2

## 2022-10-29 MED ORDER — EPHEDRINE 5 MG/ML INJ
INTRAVENOUS | Status: AC
  Filled 2022-10-29: qty 5

## 2022-10-29 SURGICAL SUPPLY — 60 items
ADH SKN CLS APL DERMABOND .7 (GAUZE/BANDAGES/DRESSINGS) ×2
APL SKNCLS STERI-STRIP NONHPOA (GAUZE/BANDAGES/DRESSINGS)
BAG COUNTER SPONGE SURGICOUNT (BAG) ×1 IMPLANT
BAG SPNG CNTER NS LX DISP (BAG) ×1
BENZOIN TINCTURE PRP APPL 2/3 (GAUZE/BANDAGES/DRESSINGS) IMPLANT
BLADE CLIPPER SURG (BLADE) IMPLANT
BLADE SURG 11 STRL SS (BLADE) ×1 IMPLANT
CANISTER SUCT 3000ML PPV (MISCELLANEOUS) ×1 IMPLANT
DERMABOND ADVANCED .7 DNX12 (GAUZE/BANDAGES/DRESSINGS) ×1 IMPLANT
DRAPE CAMERA VIDEO/LASER (DRAPES) ×1 IMPLANT
DRAPE HALF SHEET 40X57 (DRAPES) IMPLANT
DRAPE LAPAROTOMY 100X72 PEDS (DRAPES) ×1 IMPLANT
DRAPE MICROSCOPE SLANT 54X150 (MISCELLANEOUS) ×1 IMPLANT
DRSG OPSITE POSTOP 3X4 (GAUZE/BANDAGES/DRESSINGS) IMPLANT
DRSG TEGADERM 4X4.75 (GAUZE/BANDAGES/DRESSINGS) ×4 IMPLANT
DURAPREP 6ML APPLICATOR 50/CS (WOUND CARE) ×1 IMPLANT
ELECT COATED BLADE 2.86 ST (ELECTRODE) ×1 IMPLANT
ELECT REM PT RETURN 9FT ADLT (ELECTROSURGICAL) ×1
ELECTRODE REM PT RTRN 9FT ADLT (ELECTROSURGICAL) ×1 IMPLANT
GAUZE 4X4 16PLY ~~LOC~~+RFID DBL (SPONGE) IMPLANT
GENERATOR M1000 SENTIVA (Generator) IMPLANT
GLOVE BIOGEL PI IND STRL 7.0 (GLOVE) IMPLANT
GLOVE BIOGEL PI IND STRL 7.5 (GLOVE) ×1 IMPLANT
GLOVE BIOGEL PI IND STRL 8 (GLOVE) IMPLANT
GLOVE ECLIPSE 7.0 STRL STRAW (GLOVE) ×1 IMPLANT
GLOVE ECLIPSE 8.0 STRL XLNG CF (GLOVE) IMPLANT
GLOVE SURG SS PI 7.0 STRL IVOR (GLOVE) IMPLANT
GOWN STRL REUS W/ TWL LRG LVL3 (GOWN DISPOSABLE) ×2 IMPLANT
GOWN STRL REUS W/ TWL XL LVL3 (GOWN DISPOSABLE) IMPLANT
GOWN STRL REUS W/TWL 2XL LVL3 (GOWN DISPOSABLE) IMPLANT
GOWN STRL REUS W/TWL LRG LVL3 (GOWN DISPOSABLE) ×2
GOWN STRL REUS W/TWL XL LVL3 (GOWN DISPOSABLE) ×1
HEMOSTAT POWDER KIT SURGIFOAM (HEMOSTASIS) ×1 IMPLANT
KIT BASIN OR (CUSTOM PROCEDURE TRAY) ×1 IMPLANT
KIT TURNOVER KIT B (KITS) ×1 IMPLANT
LEAD PERENNIAFLEX 2-3 304 (Neuro Prosthesis/Implant) IMPLANT
LOOP VASCLR MAXI BLUE 18IN ST (MISCELLANEOUS) IMPLANT
LOOP VASCULAR MAXI 18 BLUE (MISCELLANEOUS) ×1
LOOP VASCULAR MINI 18 RED (MISCELLANEOUS)
LOOPS VASCLR MAXI BLUE 18IN ST (MISCELLANEOUS) ×1 IMPLANT
NDL HYPO 22X1.5 SAFETY MO (MISCELLANEOUS) ×1 IMPLANT
NEEDLE HYPO 22X1.5 SAFETY MO (MISCELLANEOUS) ×1 IMPLANT
NS IRRIG 1000ML POUR BTL (IV SOLUTION) ×1 IMPLANT
PACK LAMINECTOMY NEURO (CUSTOM PROCEDURE TRAY) ×1 IMPLANT
PAD ARMBOARD 7.5X6 YLW CONV (MISCELLANEOUS) ×3 IMPLANT
SPIKE FLUID TRANSFER (MISCELLANEOUS) ×1 IMPLANT
SPONGE INTESTINAL PEANUT (DISPOSABLE) IMPLANT
SPONGE SURGIFOAM ABS GEL SZ50 (HEMOSTASIS) IMPLANT
SUT ETHILON 3 0 FSL (SUTURE) IMPLANT
SUT NURALON 4 0 TR CR/8 (SUTURE) ×1 IMPLANT
SUT VIC AB 0 CT1 27 (SUTURE) ×1
SUT VIC AB 0 CT1 27XBRD ANBCTR (SUTURE) ×1 IMPLANT
SUT VIC AB 3-0 SH 8-18 (SUTURE) ×1 IMPLANT
SUT VICRYL 3-0 RB1 18 ABS (SUTURE) ×2 IMPLANT
TOWEL GREEN STERILE (TOWEL DISPOSABLE) ×1 IMPLANT
TOWEL GREEN STERILE FF (TOWEL DISPOSABLE) ×1 IMPLANT
TUNNELING TOOL (MISCELLANEOUS) IMPLANT
VASCULAR TIE MAXI BLUE 18IN ST (MISCELLANEOUS) ×1
VASCULAR TIE MINI RED 18IN STL (MISCELLANEOUS) IMPLANT
WATER STERILE IRR 1000ML POUR (IV SOLUTION) ×1 IMPLANT

## 2022-10-29 NOTE — Anesthesia Preprocedure Evaluation (Signed)
Anesthesia Evaluation  Patient identified by MRN, date of birth, ID band Patient awake    Reviewed: Allergy & Precautions, H&P , NPO status , Patient's Chart, lab work & pertinent test results  Airway Mallampati: II   Neck ROM: full    Dental   Pulmonary former smoker   breath sounds clear to auscultation       Cardiovascular negative cardio ROS  Rhythm:regular Rate:Normal     Neuro/Psych Seizures -,     GI/Hepatic   Endo/Other    Renal/GU      Musculoskeletal   Abdominal   Peds  Hematology   Anesthesia Other Findings   Reproductive/Obstetrics                             Anesthesia Physical Anesthesia Plan  ASA: 2  Anesthesia Plan: General   Post-op Pain Management:    Induction: Intravenous  PONV Risk Score and Plan: 2 and Ondansetron, Dexamethasone, Midazolam and Treatment may vary due to age or medical condition  Airway Management Planned: Oral ETT  Additional Equipment:   Intra-op Plan:   Post-operative Plan: Extubation in OR  Informed Consent: I have reviewed the patients History and Physical, chart, labs and discussed the procedure including the risks, benefits and alternatives for the proposed anesthesia with the patient or authorized representative who has indicated his/her understanding and acceptance.     Dental advisory given  Plan Discussed with: CRNA, Anesthesiologist and Surgeon  Anesthesia Plan Comments:        Anesthesia Quick Evaluation

## 2022-10-29 NOTE — Op Note (Signed)
  NEUROSURGERY OPERATIVE NOTE   PREOP DIAGNOSIS: Medically intractable epilepsy   POSTOP DIAGNOSIS: Same  PROCEDURE: 1. Placement of left Vagal nerve stimulator  SURGEON: Dr. Lisbeth Renshaw, MD  ASSISTANT: Dr. Monia Pouch, MD  ANESTHESIA: General Endotracheal  EBL: Minimal  SPECIMENS: None  DRAINS: None  COMPLICATIONS: None immediate  CONDITION: Hemodynamically stable to PACU  HISTORY: Harold Ellis is a 52 y.o.  man who was initially seen in the outpatient clinic as a referral from Dr. Loleta Chance for medically intractable epilepsy. He continues to have seizures despite AEDs. The patient appeared to be a good candidate for the procedure. The risks and benefits of the surgery were reviewed in detail with the patient and family. After all questions were answered, informed consent was obtained.  PROCEDURE IN DETAIL: After informed consent was obtained and witnessed, the patient was brought to the operating room. After induction of general anesthesia, the patient was positioned on the operative table in the supine position. All pressure points were meticulously padded. Skin incisions were then marked out and prepped and draped in the usual sterile fashion.  After timeout was conducted, left transverse neck skin incision was infiltrated with local anesthetic with epinephrine. Skin incision was then made sharply, and Bovie electrocautery was used to dissect the subcutaneous tissue. The platysma muscle was identified and incised. The platysma was then undermined. The medial border of the sternocleidomastoid muscle was identified, and dissection was carried out utilizing natural tissue planes of the neck until the carotid sheath was identified. The jugular vein was initially identified and the carotid sheath was incised. The vagus nerve was then identified running between the carotid and jugular. The vagus nerve was circumferentially dissected for length of a proximally 2-1/2 cm. The  infraclavicular incision was then infiltrated with local and opened sharply, and Bovie electrocautery was used to dissect the subcutaneous tissue. A subcutaneous pocket was then made. A subcutaneous tunneler was then passed from the neck to the infraclavicular incision. The vagal stimulator leads were then tunneled. The leads were then placed on the vagus nerve. A relaxing curl was then placed, and the leaves were tacked to the sternocleidomastoid muscle. The generator was then connected to the leads, and placed in the pocket.  Testing was done to confirm normal impedance, and good placement and heart rate detection.   Wounds were then irrigated with copious amounts of normal saline irrigation. Relaxing curl was placed on the neck leads and tacked to the SCM. The platysma was closed using interrupted 3-0 Vicryl stitches. Subcutaneous layer and the subclavicular incision was closed using interrupted 3-0 Vicryl stitches. Skin was then closed using interrupted 3-0 Vicryl, and a layer of Dermabond was applied.   At the end of the case all sponge, needle, cottonoid, and instrument counts were correct. The patient was then extubated, transferred to the stretcher, and taken to the postanesthesia care unit in stable hemodynamic condition.   Lisbeth Renshaw, MD [facility]
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
## 2022-10-29 NOTE — Anesthesia Procedure Notes (Signed)
Procedure Name: Intubation Date/Time: 10/29/2022 11:01 AM  Performed by: Nils Pyle, CRNAPre-anesthesia Checklist: Patient identified, Emergency Drugs available, Suction available and Patient being monitored Patient Re-evaluated:Patient Re-evaluated prior to induction Oxygen Delivery Method: Circle System Utilized Preoxygenation: Pre-oxygenation with 100% oxygen Induction Type: IV induction Ventilation: Mask ventilation without difficulty Laryngoscope Size: Miller and 2 Grade View: Grade I Tube type: Oral Tube size: 7.5 mm Number of attempts: 1 Airway Equipment and Method: Stylet and Oral airway Placement Confirmation: ETT inserted through vocal cords under direct vision, positive ETCO2 and breath sounds checked- equal and bilateral Secured at: 23 cm Tube secured with: Tape Dental Injury: Teeth and Oropharynx as per pre-operative assessment

## 2022-10-29 NOTE — Transfer of Care (Signed)
Immediate Anesthesia Transfer of Care Note  Patient: Harold Ellis  Procedure(s) Performed: VAGAL NERVE STIMULATOR IMPLANT  Patient Location: PACU  Anesthesia Type:General  Level of Consciousness: awake, alert , and oriented  Airway & Oxygen Therapy: Patient Spontanous Breathing  Post-op Assessment: Report given to RN, Post -op Vital signs reviewed and stable, and Patient moving all extremities X 4  Post vital signs: Reviewed and stable  Last Vitals:  Vitals Value Taken Time  BP 123/90 10/29/22 1342  Temp    Pulse 85 10/29/22 1343  Resp 19 10/29/22 1343  SpO2 95 % 10/29/22 1343  Vitals shown include unfiled device data.  Last Pain:  Vitals:   10/29/22 0931  TempSrc:   PainSc: 0-No pain         Complications: No notable events documented.

## 2022-10-30 NOTE — Anesthesia Postprocedure Evaluation (Signed)
Anesthesia Post Note  Patient: Harold Ellis  Procedure(s) Performed: VAGAL NERVE STIMULATOR IMPLANT     Patient location during evaluation: PACU Anesthesia Type: General Level of consciousness: awake and alert Pain management: pain level controlled Vital Signs Assessment: post-procedure vital signs reviewed and stable Respiratory status: spontaneous breathing, nonlabored ventilation, respiratory function stable and patient connected to nasal cannula oxygen Cardiovascular status: blood pressure returned to baseline and stable Postop Assessment: no apparent nausea or vomiting Anesthetic complications: no   No notable events documented.  Last Vitals:  Vitals:   10/29/22 1400 10/29/22 1415  BP: 124/85 (!) 124/91  Pulse: 81 75  Resp: 14 17  Temp:  36.6 C  SpO2: 94% 93%    Last Pain:  Vitals:   10/29/22 1415  TempSrc:   PainSc: 3                  Antuan Limes S
